# Patient Record
Sex: Male | Born: 1940 | Race: White | Hispanic: No | Marital: Married | Smoking: Never smoker
Health system: Southern US, Community
[De-identification: ages and names within clinical notes are randomized; demographics above are authoritative.]

## PROBLEM LIST (undated history)

## (undated) DIAGNOSIS — F419 Anxiety disorder, unspecified: Secondary | ICD-10-CM

## (undated) DIAGNOSIS — D099 Carcinoma in situ, unspecified: Secondary | ICD-10-CM

## (undated) DIAGNOSIS — R569 Unspecified convulsions: Secondary | ICD-10-CM

## (undated) DIAGNOSIS — N320 Bladder-neck obstruction: Secondary | ICD-10-CM

## (undated) DIAGNOSIS — Z87442 Personal history of urinary calculi: Secondary | ICD-10-CM

## (undated) DIAGNOSIS — C4491 Basal cell carcinoma of skin, unspecified: Secondary | ICD-10-CM

## (undated) DIAGNOSIS — F32A Depression, unspecified: Secondary | ICD-10-CM

## (undated) DIAGNOSIS — N281 Cyst of kidney, acquired: Secondary | ICD-10-CM

## (undated) DIAGNOSIS — I10 Essential (primary) hypertension: Secondary | ICD-10-CM

## (undated) HISTORY — PX: INGUINAL HERNIA REPAIR: SUR1180

## (undated) HISTORY — DX: Carcinoma in situ, unspecified: D09.9

## (undated) HISTORY — DX: Bladder-neck obstruction: N32.0

## (undated) HISTORY — DX: Basal cell carcinoma of skin, unspecified: C44.91

## (undated) HISTORY — DX: Anxiety disorder, unspecified: F41.9

## (undated) HISTORY — PX: TONSILLECTOMY: SUR1361

## (undated) HISTORY — DX: Personal history of urinary calculi: Z87.442

## (undated) HISTORY — DX: Depression, unspecified: F32.A

## (undated) HISTORY — DX: Cyst of kidney, acquired: N28.1

## (undated) HISTORY — DX: Unspecified convulsions: R56.9

## (undated) HISTORY — DX: Essential (primary) hypertension: I10

---

## 2000-01-17 ENCOUNTER — Encounter (HOSPITAL_BASED_OUTPATIENT_CLINIC_OR_DEPARTMENT_OTHER): Payer: Self-pay | Admitting: General Surgery

## 2000-01-21 ENCOUNTER — Ambulatory Visit (HOSPITAL_COMMUNITY): Admission: RE | Admit: 2000-01-21 | Discharge: 2000-01-21 | Payer: Self-pay | Admitting: General Surgery

## 2000-04-09 ENCOUNTER — Encounter: Admission: RE | Admit: 2000-04-09 | Discharge: 2000-04-09 | Payer: Self-pay | Admitting: Family Medicine

## 2000-04-09 ENCOUNTER — Encounter: Payer: Self-pay | Admitting: Family Medicine

## 2000-07-20 ENCOUNTER — Encounter: Admission: RE | Admit: 2000-07-20 | Discharge: 2000-07-20 | Payer: Self-pay | Admitting: Family Medicine

## 2000-07-20 ENCOUNTER — Encounter: Payer: Self-pay | Admitting: Family Medicine

## 2016-02-07 DIAGNOSIS — I10 Essential (primary) hypertension: Secondary | ICD-10-CM | POA: Insufficient documentation

## 2016-02-07 DIAGNOSIS — E7800 Pure hypercholesterolemia, unspecified: Secondary | ICD-10-CM | POA: Insufficient documentation

## 2016-02-08 DIAGNOSIS — N320 Bladder-neck obstruction: Secondary | ICD-10-CM | POA: Insufficient documentation

## 2016-02-08 DIAGNOSIS — R12 Heartburn: Secondary | ICD-10-CM | POA: Insufficient documentation

## 2016-02-08 DIAGNOSIS — R439 Unspecified disturbances of smell and taste: Secondary | ICD-10-CM | POA: Insufficient documentation

## 2016-02-08 DIAGNOSIS — R2241 Localized swelling, mass and lump, right lower limb: Secondary | ICD-10-CM | POA: Insufficient documentation

## 2016-03-07 ENCOUNTER — Ambulatory Visit
Admission: RE | Admit: 2016-03-07 | Discharge: 2016-03-07 | Disposition: A | Discharge status: Home / Self Care | Payer: Medicare Other | Source: Ambulatory Visit | Attending: Family Medicine | Admitting: Family Medicine

## 2016-03-07 ENCOUNTER — Other Ambulatory Visit: Payer: Self-pay | Admitting: Family Medicine

## 2016-03-07 DIAGNOSIS — R109 Unspecified abdominal pain: Secondary | ICD-10-CM

## 2016-03-07 IMAGING — CT CT ABD-PELV W/ CM
3 of 5 series · 12 of 36 positions shown, 18 images · IV contrast (OMNI 300/WATER & [ID] ISOVUE 300)
Comparison: Report from previous CT on [DATE] ; images no
longer available

CLINICAL DATA: Right-sided abdominal and pelvic pain and distention
for several weeks.

EXAM:
CT ABDOMEN AND PELVIS WITH CONTRAST
TECHNIQUE: Multidetector CT imaging of the abdomen and pelvis was performed
using the standard protocol following bolus administration of
intravenous contrast.
CONTRAST:  100mL [9Q] IOPAMIDOL ([9Q]) INJECTION 61%

[Series 3: abd/pelvis with · axial · 0.77mm/px · z∈[-368,-14]mm · 7 of 95 slices shown, 12 images]
[im 12/95  soft-tissue]
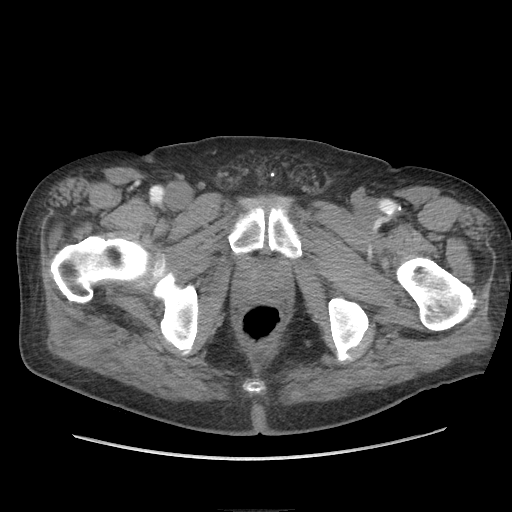
[im 12/95  bone]
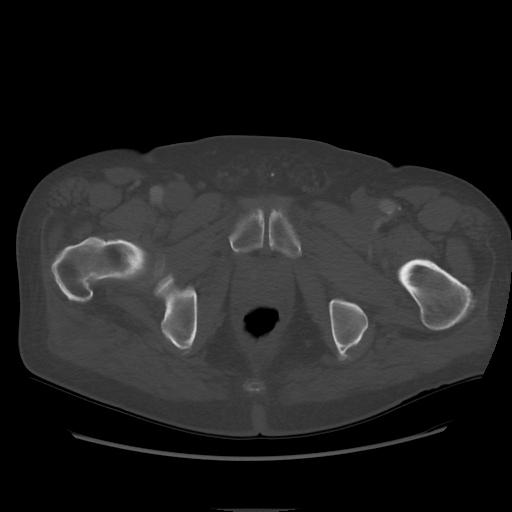
[im 24/95  soft-tissue]
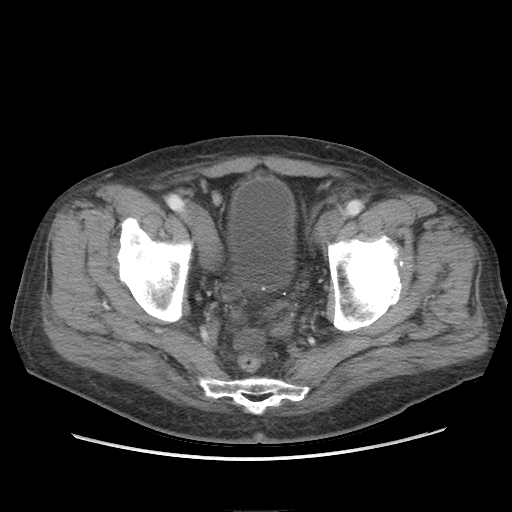
[im 36/95  soft-tissue]
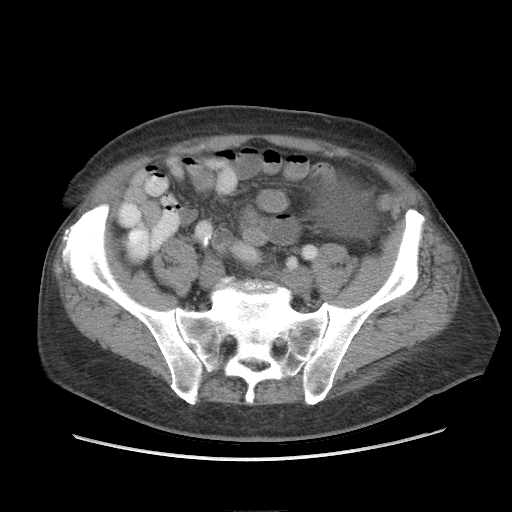
[im 48/95  soft-tissue]
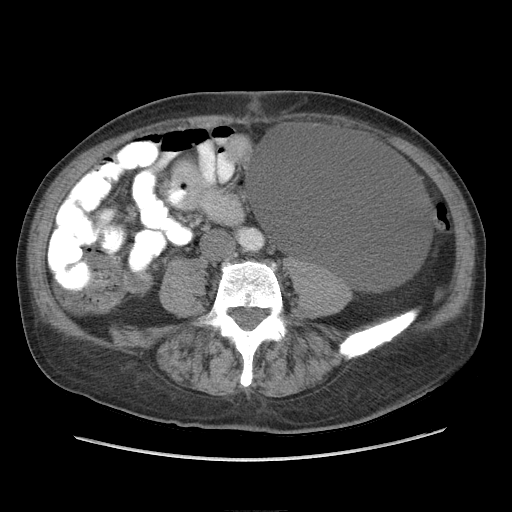
[im 48/95  lung]
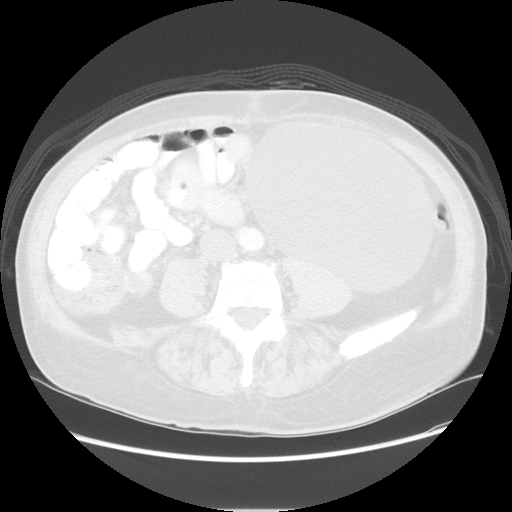
[im 59/95  soft-tissue]
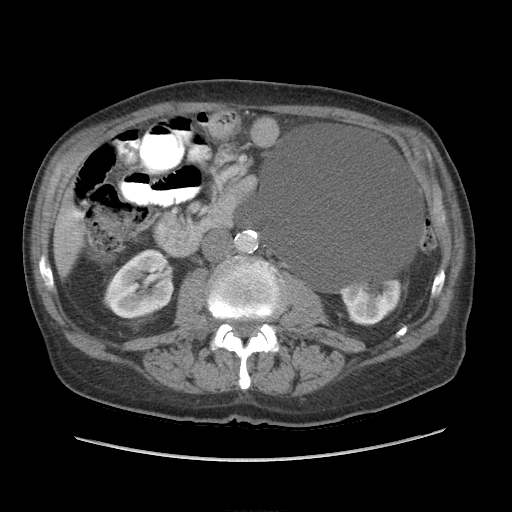
[im 59/95  lung]
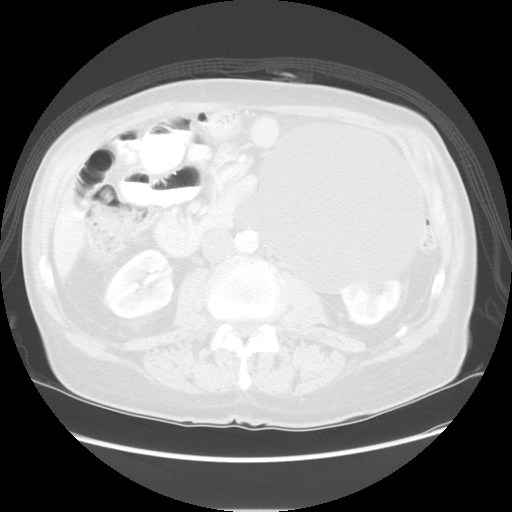
[im 71/95  soft-tissue]
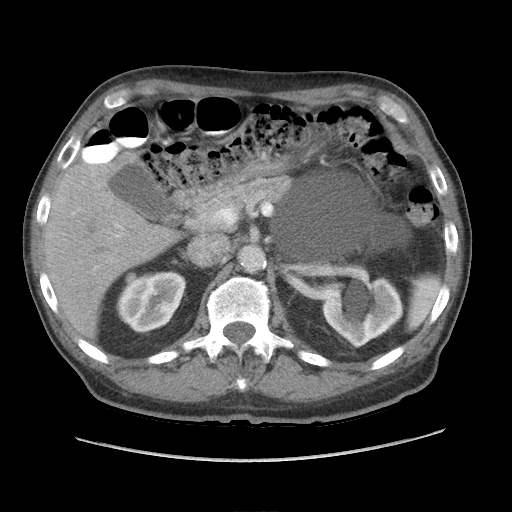
[im 71/95  lung]
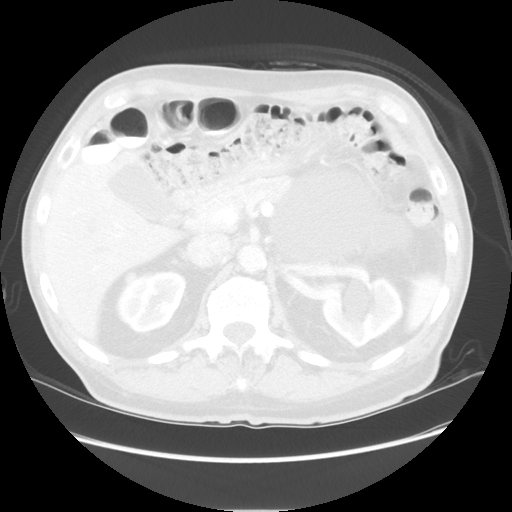
[im 83/95  soft-tissue]
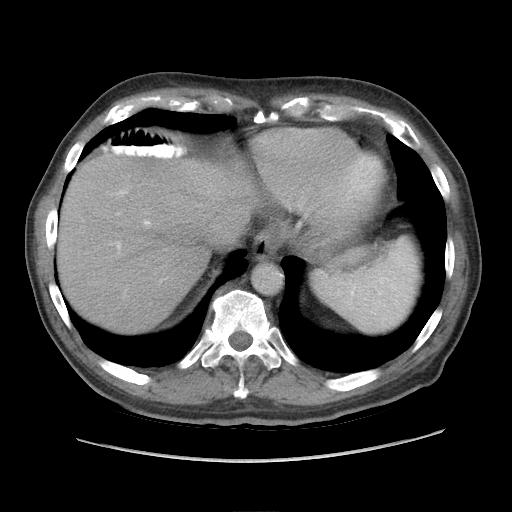
[im 83/95  lung]
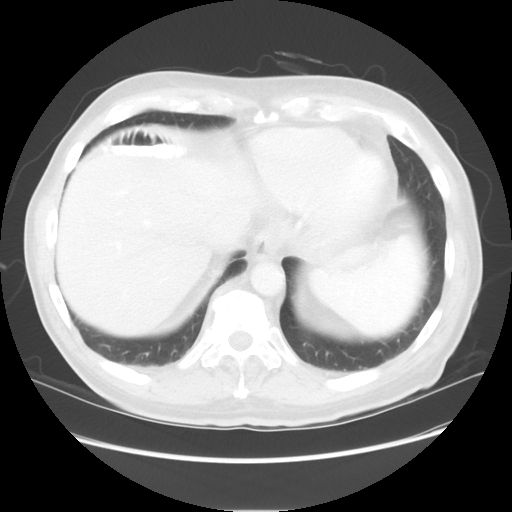

[Series 601: coronal body · coronal · 0.96mm/px · 1 of 120 slices shown, 2 images]
[im 40/120  soft-tissue]
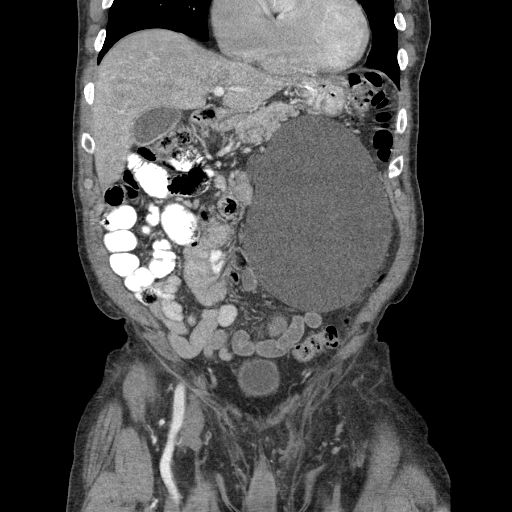
[im 40/120  bone]
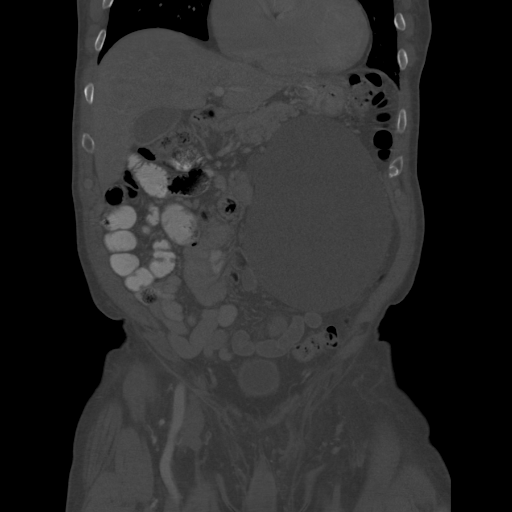

[Series 602: sagittal body · sagittal · 0.96mm/px · 4 of 156 slices shown]
[im 12/156  soft-tissue]
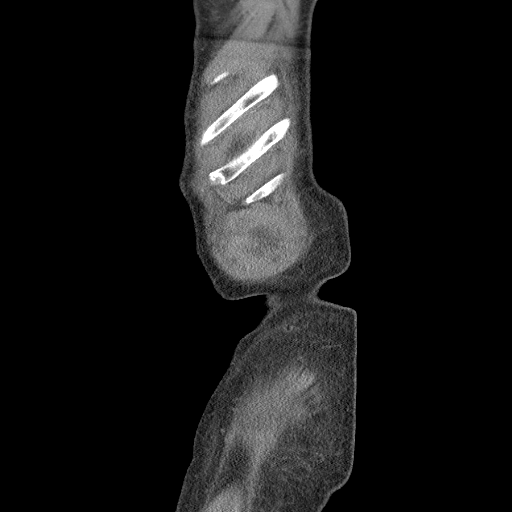
[im 34/156  soft-tissue]
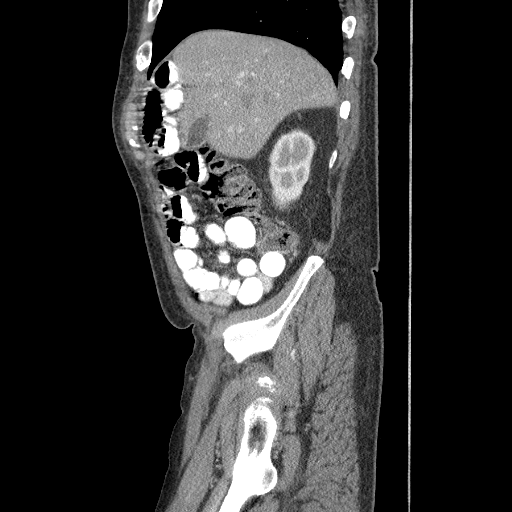
[im 56/156  soft-tissue]
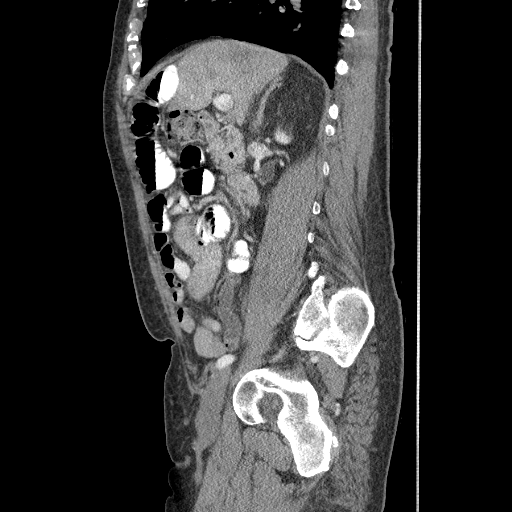
[im 67/156  soft-tissue]
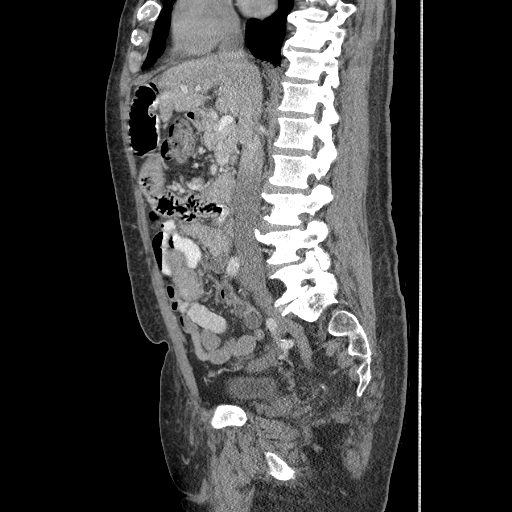

[12 of 36 positions shown; findings below may reference images not displayed]

FINDINGS: Lower chest:  No acute findings.

Hepatobiliary: No masses or other significant abnormality.
Gallbladder is unremarkable.

Pancreas: No mass, inflammatory changes, or other significant
abnormality.

Spleen: Within normal limits in size and appearance.

Adrenals/Urinary Tract: Normal adrenal glands.

8 mm subcapsular lesion in the anterior upper pole of the right
kidney on image 25/series 3 shows findings suspicious for contrast
enhancement with washout on delayed imaging, suspicious for a small
solid renal neoplasm.

A large simple appearing cyst is seen arising from the mid and lower
pole of the left kidney which measures 19.4 x 14.7 cm. No complex
features are identified. This has increased in size from 8 x 10 cm
given on previous CT report. Moderate left hydronephrosis is seen
which appears to be due to mass effect by this large cystic lesion.
This was described on prior report.

No evidence of ureteral calculi or dilatation. 3 mm calculus noted
within the urinary bladder.

Stomach/Bowel: No evidence of obstruction, inflammatory process, or
abnormal fluid collections. Normal appendix visualized. Sigmoid
diverticulosis is demonstrated, without evidence of diverticulitis.

Vascular/Lymphatic: No pathologically enlarged lymph nodes. No
evidence of abdominal aortic aneurysm. Aortic atherosclerosis.

Reproductive: Moderately enlarged prostate with mass effect on
bladder base.

Other: None.

Musculoskeletal:  No suspicious bone lesions identified.
IMPRESSION: 8 mm subcapsular lesion in anterior upper pole of right kidney shows
evidence of contrast enhancement, suspicious for a solid renal
neoplasm. Abdomen MRI without and with contrast is recommended for
further characterization. This recommendation follows ACR consensus
guidelines: Management of the Incidental Renal Mass on CT: A White
Paper of the ACR Incidental Findings Committee. [HOSPITAL]
[9Q]; article in press.

19 cm benign-appearing left renal cyst, which is increased in size
from 10 mm given on previous CT report in [9Q]. Moderate left
hydronephrosis appears to be secondary to mass effect by this cystic
lesion, and was described on previous CT report. No evidence of
ureteral calculi or dilatation.

Moderately enlarged prostate gland, with tiny nonobstructive
calculus within the urinary bladder.

Colonic diverticulosis. No radiographic evidence of diverticulitis.

Aortic atherosclerosis.

## 2016-03-07 MED ORDER — IOHEXOL 300 MG/ML  SOLN
30.0000 mL | Freq: Once | INTRAMUSCULAR | Status: AC | PRN
  Administered 2016-03-07: 30 mL via ORAL

## 2016-03-07 MED ORDER — IOPAMIDOL (ISOVUE-300) INJECTION 61%
100.0000 mL | Freq: Once | INTRAVENOUS | Status: AC | PRN
Start: 2016-03-07 — End: 2016-03-07
  Administered 2016-03-07: 100 mL via INTRAVENOUS

## 2016-04-06 DIAGNOSIS — N289 Disorder of kidney and ureter, unspecified: Secondary | ICD-10-CM | POA: Insufficient documentation

## 2016-07-23 DIAGNOSIS — F4321 Adjustment disorder with depressed mood: Secondary | ICD-10-CM | POA: Insufficient documentation

## 2016-11-13 DIAGNOSIS — L858 Other specified epidermal thickening: Secondary | ICD-10-CM | POA: Insufficient documentation

## 2016-11-30 DIAGNOSIS — C44609 Unspecified malignant neoplasm of skin of left upper limb, including shoulder: Secondary | ICD-10-CM | POA: Insufficient documentation

## 2016-11-30 DIAGNOSIS — D229 Melanocytic nevi, unspecified: Secondary | ICD-10-CM | POA: Insufficient documentation

## 2016-11-30 DIAGNOSIS — L570 Actinic keratosis: Secondary | ICD-10-CM | POA: Insufficient documentation

## 2017-07-16 DIAGNOSIS — Z0000 Encounter for general adult medical examination without abnormal findings: Secondary | ICD-10-CM | POA: Insufficient documentation

## 2019-08-03 ENCOUNTER — Ambulatory Visit: Payer: Medicare PPO | Attending: Internal Medicine

## 2019-08-03 DIAGNOSIS — Z23 Encounter for immunization: Secondary | ICD-10-CM

## 2019-08-03 NOTE — Progress Notes (Signed)
   Covid-19 Vaccination Clinic  Name:  Bill Fry    MRN: [id number] DOB: 07-03-1941  08/03/2019  [name] was observed post Covid-19 immunization for 15 minutes without incidence. He was provided with Vaccine Information Sheet and instruction to access the V-Safe system.   [name] was instructed to call 911 with any severe reactions post vaccine: Marland Kitchen Difficulty breathing  . Swelling of your face and throat  . A fast heartbeat  . A bad rash all over your body  . Dizziness and weakness    Immunizations Administered    Name Date Dose VIS Date Route   Pfizer COVID-19 Vaccine 08/03/2019 10:40 AM 0.3 mL 06/24/2019 Intramuscular   Manufacturer: [place]   Lot: [id number]   [place]: [id number]

## 2019-08-22 ENCOUNTER — Ambulatory Visit: Payer: Medicare PPO | Attending: Internal Medicine

## 2019-08-22 DIAGNOSIS — Z23 Encounter for immunization: Secondary | ICD-10-CM

## 2019-08-22 NOTE — Progress Notes (Signed)
   Covid-19 Vaccination Clinic  Name:  Bill Fry    MRN: [id number] DOB: December 30, 1940  08/22/2019  Bill Fry was observed post Covid-19 immunization for 15 minutes without incidence. He was provided with Vaccine Information Sheet and instruction to access the V-Safe system.   Bill Fry was instructed to call 911 with any severe reactions post vaccine: Marland Kitchen Difficulty breathing  . Swelling of your face and throat  . A fast heartbeat  . A bad rash all over your body  . Dizziness and weakness    Immunizations Administered    Name Date Dose VIS Date Route   Pfizer COVID-19 Vaccine 08/22/2019  3:55 PM 0.3 mL 06/24/2019 Intramuscular   Manufacturer: [place]   Lot: [id number]   [place]: [id number]

## 2019-11-04 DIAGNOSIS — D49519 Neoplasm of unspecified behavior of unspecified kidney: Secondary | ICD-10-CM | POA: Insufficient documentation

## 2019-11-09 ENCOUNTER — Other Ambulatory Visit: Payer: Self-pay | Admitting: Family Medicine

## 2019-11-09 DIAGNOSIS — N320 Bladder-neck obstruction: Secondary | ICD-10-CM

## 2019-11-09 DIAGNOSIS — D49519 Neoplasm of unspecified behavior of unspecified kidney: Secondary | ICD-10-CM

## 2019-11-09 DIAGNOSIS — Q619 Cystic kidney disease, unspecified: Secondary | ICD-10-CM

## 2019-11-09 DIAGNOSIS — R404 Transient alteration of awareness: Secondary | ICD-10-CM

## 2019-12-06 ENCOUNTER — Ambulatory Visit
Admission: RE | Admit: 2019-12-06 | Discharge: 2019-12-06 | Disposition: A | Discharge status: Home / Self Care | Payer: Medicare PPO | Source: Ambulatory Visit | Attending: Family Medicine | Admitting: Family Medicine

## 2019-12-06 ENCOUNTER — Other Ambulatory Visit: Payer: Self-pay

## 2019-12-06 DIAGNOSIS — R404 Transient alteration of awareness: Secondary | ICD-10-CM

## 2019-12-06 DIAGNOSIS — N320 Bladder-neck obstruction: Secondary | ICD-10-CM

## 2019-12-06 DIAGNOSIS — Q619 Cystic kidney disease, unspecified: Secondary | ICD-10-CM

## 2019-12-06 DIAGNOSIS — D49519 Neoplasm of unspecified behavior of unspecified kidney: Secondary | ICD-10-CM

## 2019-12-06 IMAGING — MR MR PELVIS W/O CM
4 of 6 series · 23 of 48 positions shown · non-contrast
Comparison: B [X3].  [DATE]

CLINICAL DATA: History of cystic kidney disease. Bladder outflow
syndrome. Chronic symptoms.

EXAM:
MRI ABDOMEN AND PELVIS WITHOUT CONTRAST
TECHNIQUE: Multiplanar multisequence MR imaging of the abdomen and pelvis was
performed. No intravenous contrast was administered.

[Series 3: cor haste · coronal · 6.0mm · 0.70mm/px · 7 of 25 slices shown]
[im 1/25]
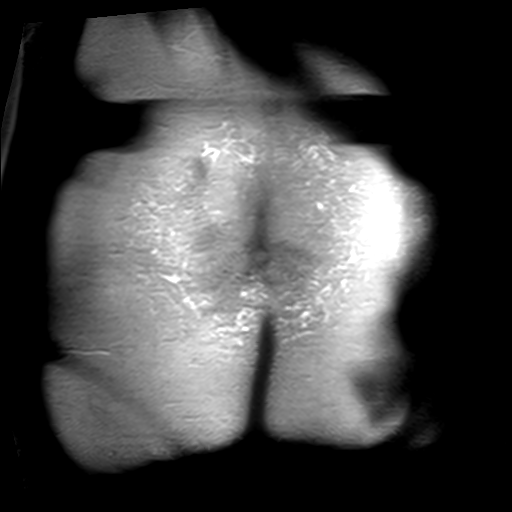
[im 5/25]
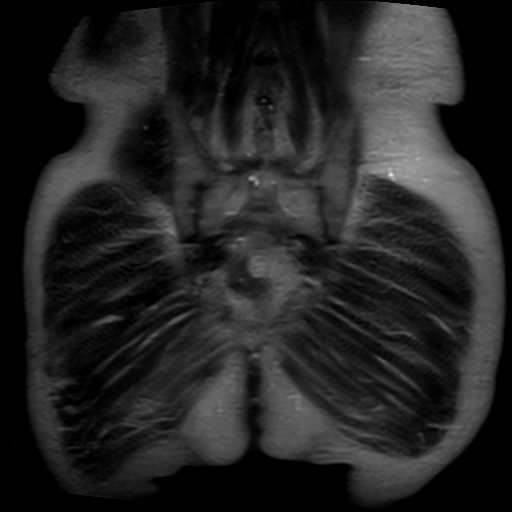
[im 9/25]
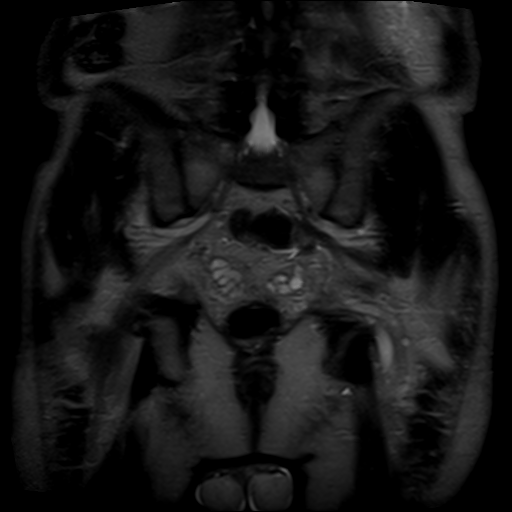
[im 13/25]
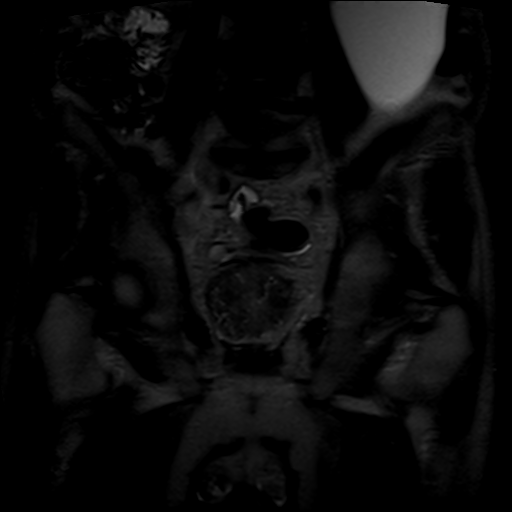
[im 17/25]
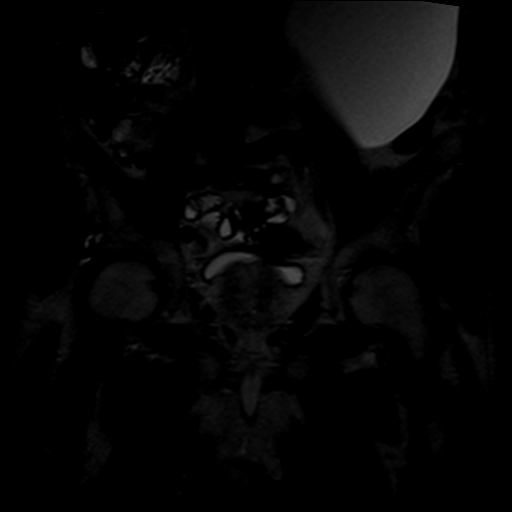
[im 21/25]
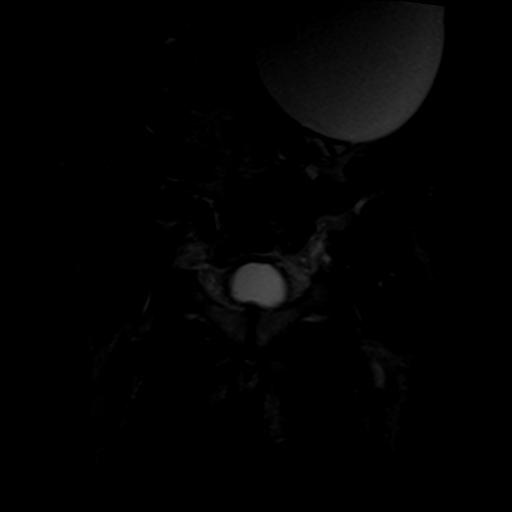
[im 25/25]
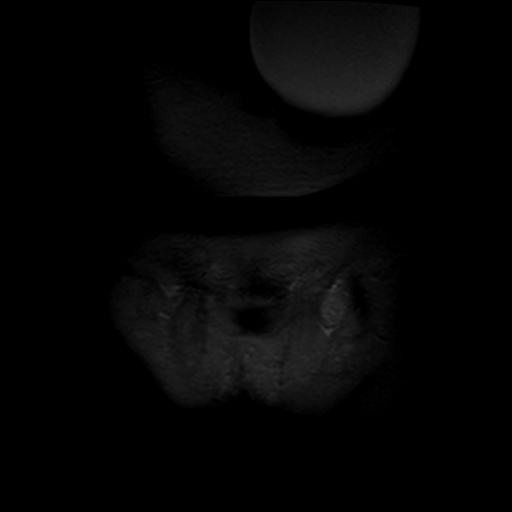

[Series 4: t2_tse_sag · sagittal · 5.0mm · 1.05mm/px · 8 of 27 slices shown]
[im 1/27]
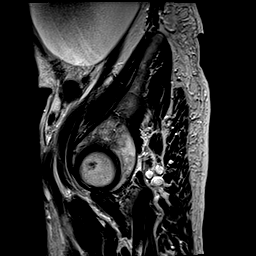
[im 4/27]
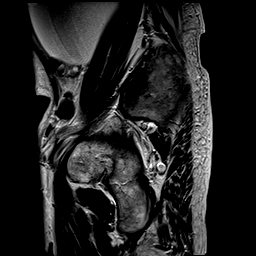
[im 8/27]
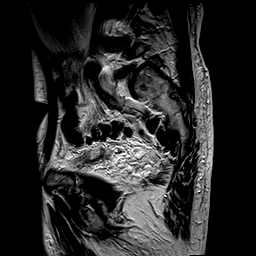
[im 12/27]
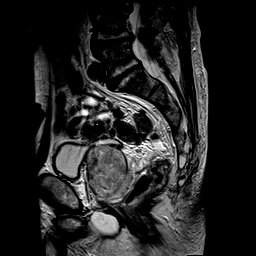
[im 15/27]
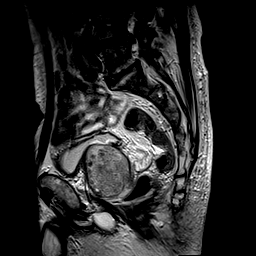
[im 19/27]
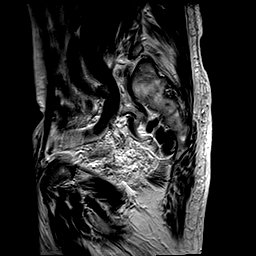
[im 23/27]
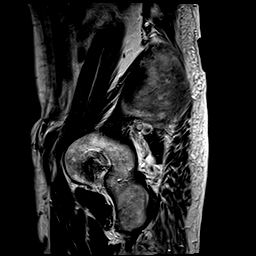
[im 27/27]
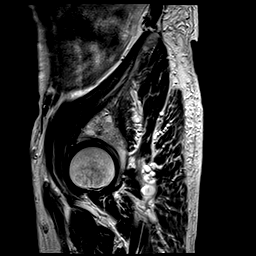

[Series 6: axial spgr-not bh · axial · 5.0mm · 0.47mm/px · z∈[-165,+4]mm · 5 of 32 slices shown]
[im 1/32]
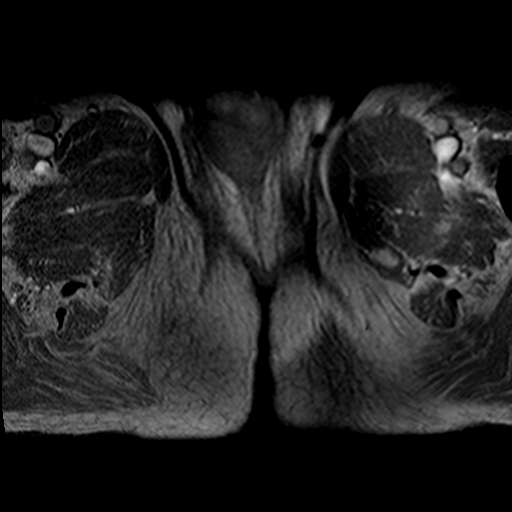
[im 4/32]
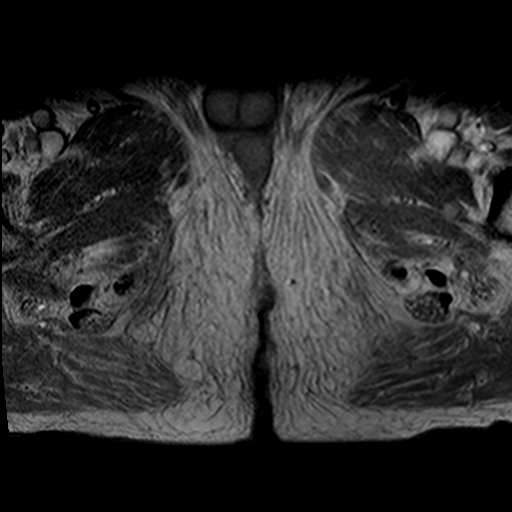
[im 8/32]
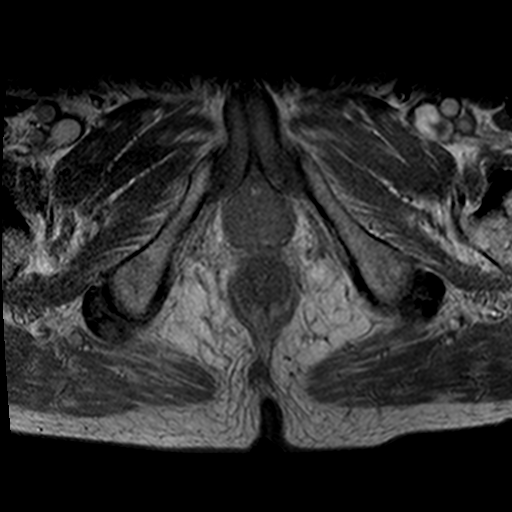
[im 16/32]
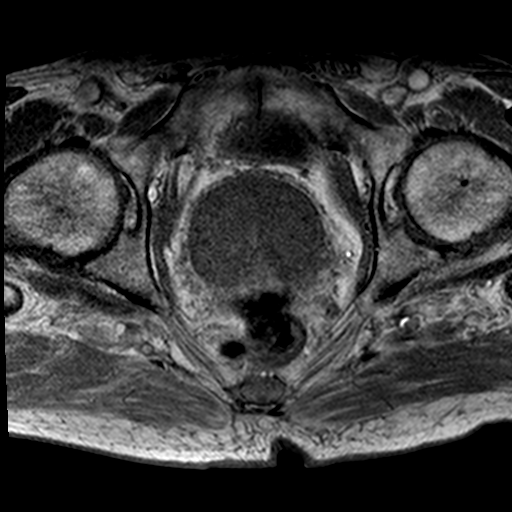
[im 28/32]
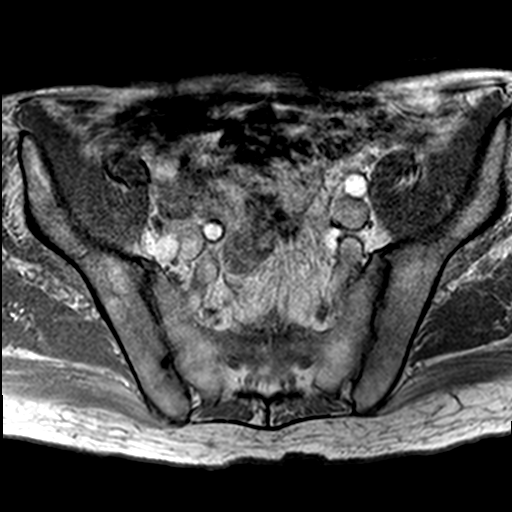

[Series 7: axial spgr fs-pre · axial · non-contrast · 5.0mm · 0.47mm/px · z∈[-133,-8]mm · 3 of 28 slices shown]
[im 4/28]
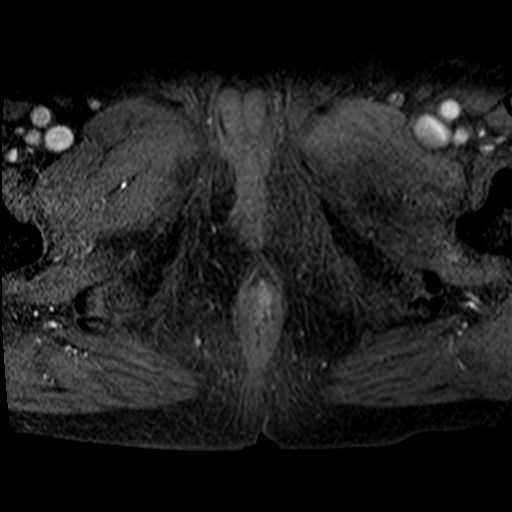
[im 16/28]
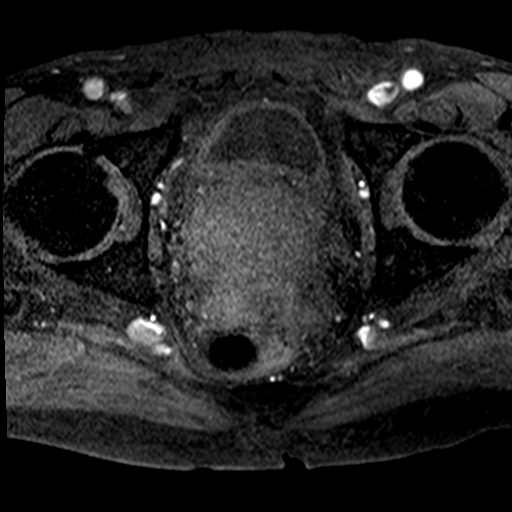
[im 24/28]
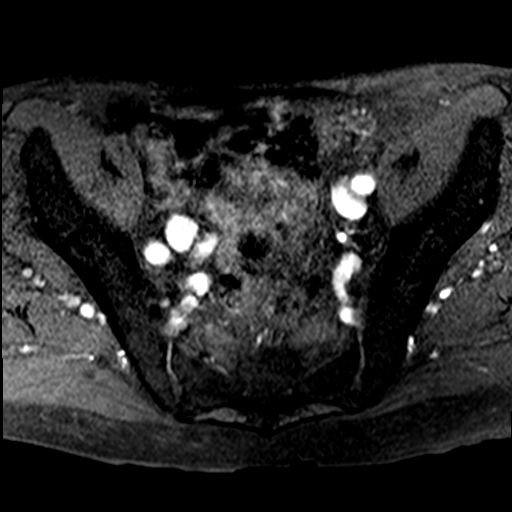

[23 of 48 positions shown; findings below may reference images not displayed]

FINDINGS: Portions of exam are mildly motion degraded.

COMBINED FINDINGS FOR BOTH MR ABDOMEN AND PELVIS

Lower chest: Mild cardiomegaly, without pericardial or pleural
effusion.

Hepatobiliary: Normal noncontrast appearance of the liver,
gallbladder, biliary tract.

Pancreas: Cystic foci within the pancreas, largest of which is in
the pancreatic head at 1.3 cm on [DATE] and [DATE]. No duct dilatation
or acute inflammation.

Spleen:  Normal in size, without focal abnormality.

Adrenals/Urinary Tract: Normal adrenal glands. Tiny bilateral renal
lesions which are likely cysts.

The partially exophytic upper/interpolar right renal 8 mm lesion on
the prior exam is similar in size, including on [DATE]. Incompletely
characterized on this noncontrast exam.

A dominant upper pole left renal lesion is most consistent with a
minimally complex cyst, with a thin septation identified on [DATE].
Measures on the order of 14.2 x 14.1 cm on [DATE]. Compare 14.4 x
cm at the same level on [DATE] (when remeasured). 20.8 cm
craniocaudal today versus 19.1 cm on the prior exam (when
remeasured). This again causes left-sided mild upper pole
caliectasis, including on [DATE].

small right-sided bladder diverticulum on [DATE]. There is also
minimal fluid signal anterior to the right-sided urinary bladder on
[DATE] which could represent a tiny bladder diverticulum.

Stomach/Bowel: Normal stomach and abdominal small bowel loops.
Extensive colonic diverticulosis.

Vascular/Lymphatic: Aortic atherosclerosis. No abdominopelvic
adenopathy.

Reproductive: Moderate prostatomegaly.

Other:  Small volume dependent pelvic fluid.

Musculoskeletal: Lumbosacral spondylosis.
IMPRESSION: 1. Mildly motion degraded exam.
2. Mild enlargement, since [X3], of a minimally complex cyst within
the left kidney. This causes similar mild left-sided upper pole
caliectasis.
3. 8 mm right renal lesion, which was suspicious on prior contrast
enhanced CT, is unchanged in size and incompletely characterized on
this noncontrast exam.
4. Prostatomegaly with at least 1 bladder diverticulum, suggesting
outlet obstruction.
5. Small volume cul-de-sac fluid, nonspecific.
6. 1.3 cm pancreatic head cystic lesion. Most likely a pseudocyst
versus less likely indolent cystic neoplasm. Per consensus criteria,
this warrants follow-up with pre and post contrast abdominal
MRI/MRCP at 2 years. This recommendation follows ACR consensus
guidelines: Management of Incidental Pancreatic Cysts: A White Paper
of the ACR Incidental Findings Committee. [HOSPITAL]

## 2019-12-06 IMAGING — MR MR ABDOMEN W/O CM
8 of 9 series · 35 of 48 positions shown · non-contrast
Comparison: B [X3].  [DATE]

CLINICAL DATA: History of cystic kidney disease. Bladder outflow
syndrome. Chronic symptoms.

EXAM:
MRI ABDOMEN AND PELVIS WITHOUT CONTRAST
TECHNIQUE: Multiplanar multisequence MR imaging of the abdomen and pelvis was
performed. No intravenous contrast was administered.

[Series 2: T2 · coronal · 5.0mm · 0.74mm/px · 2 of 27 slices shown (1 of 2)]
[im 1/27]
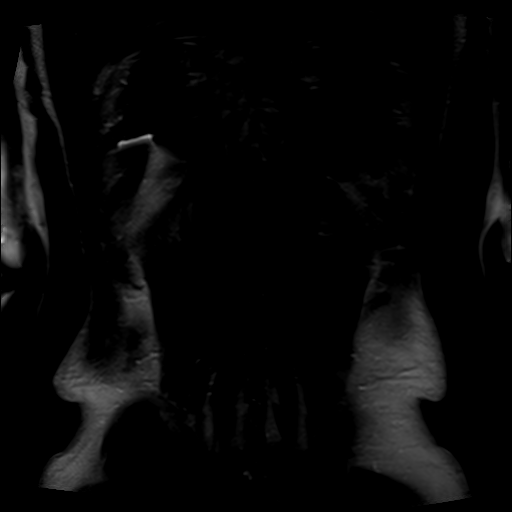
[im 27/27]
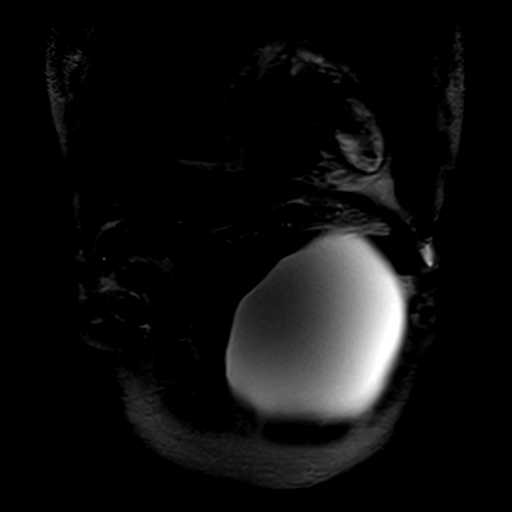

[Series 3: axial haste · axial · 6.0mm · 0.68mm/px · z∈[-264,+26]mm · 4 of 45 slices shown]
[im 1/45]
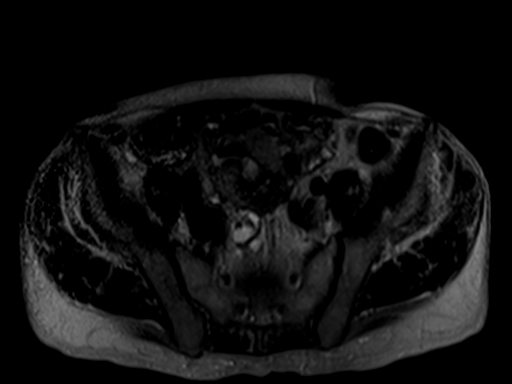
[im 15/45]
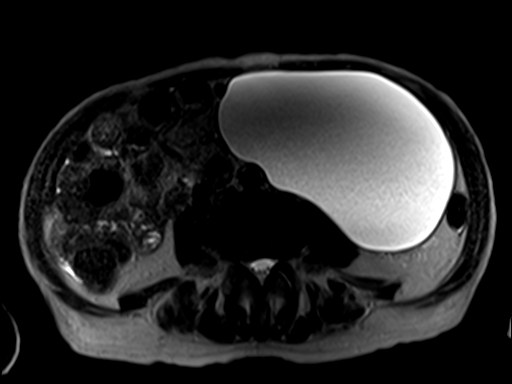
[im 30/45]
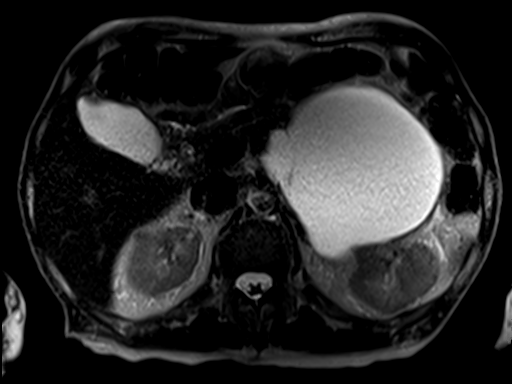
[im 45/45]
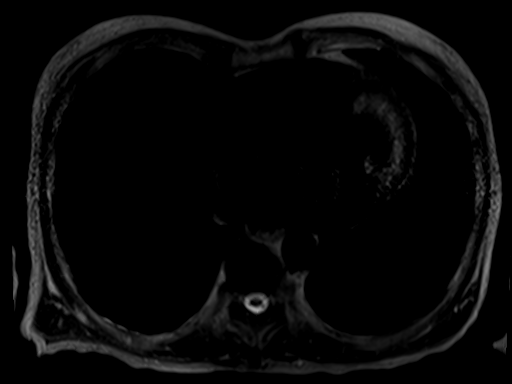

[Series 4: T1 · axial · 6.0mm · 0.68mm/px · z∈[-264,+26]mm · 7 of 90 slices shown]
[im 1/90]
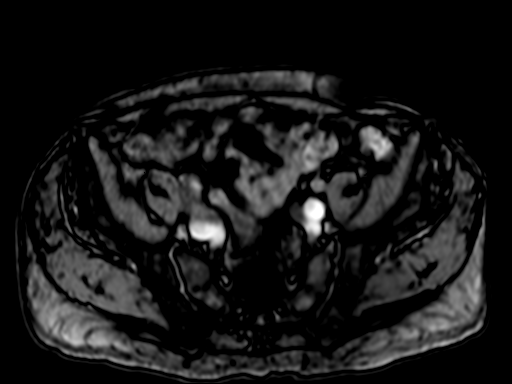
[im 15/90]
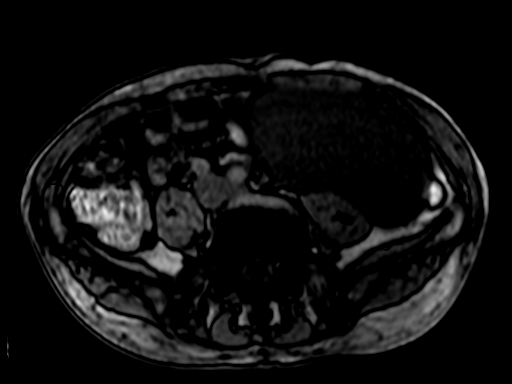
[im 30/90]
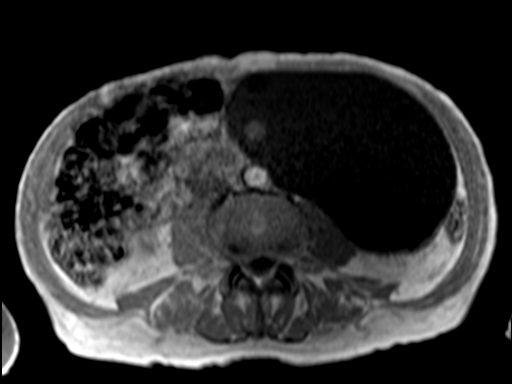
[im 45/90]
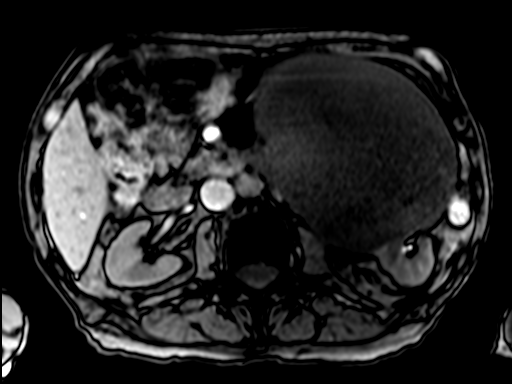
[im 60/90]
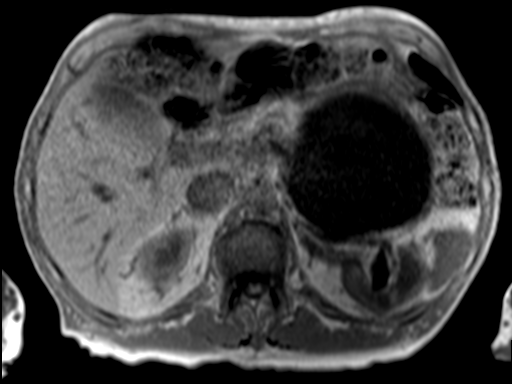
[im 75/90]
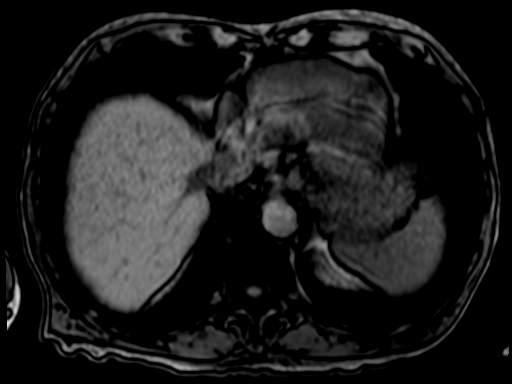
[im 90/90]
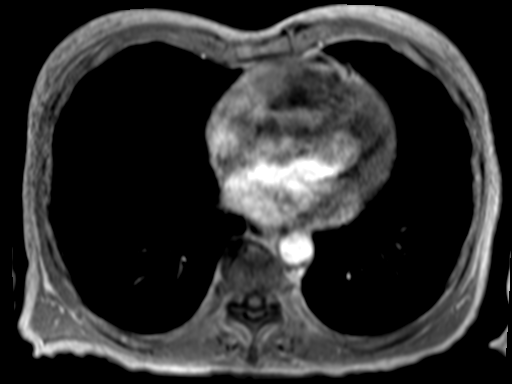

[Series 5: bSSFP · axial · 4.0mm · 0.68mm/px · z∈[-253,+15]mm · 5 of 68 slices shown]
[im 1/68]
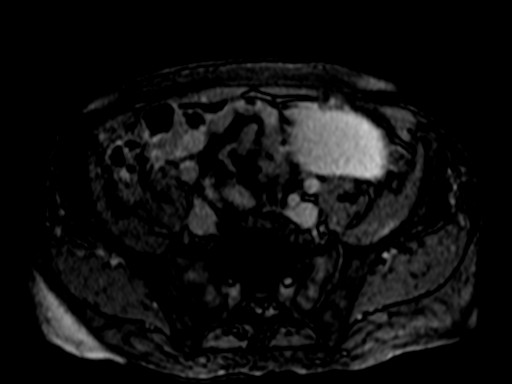
[im 17/68]
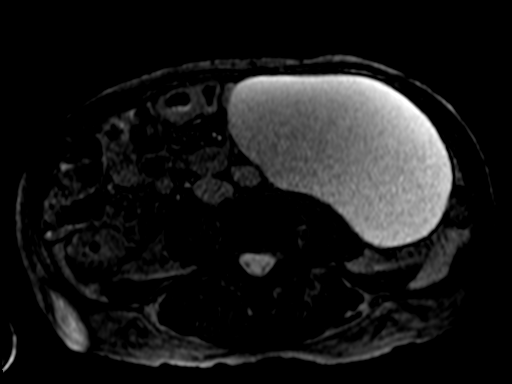
[im 34/68]
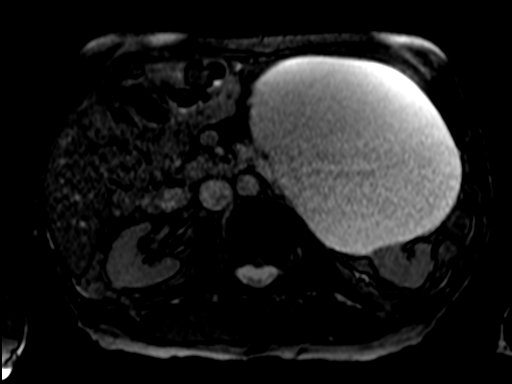
[im 51/68]
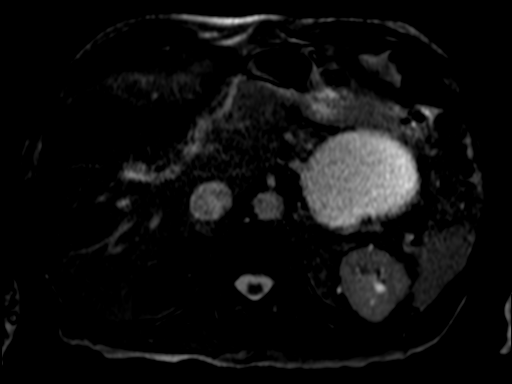
[im 68/68]
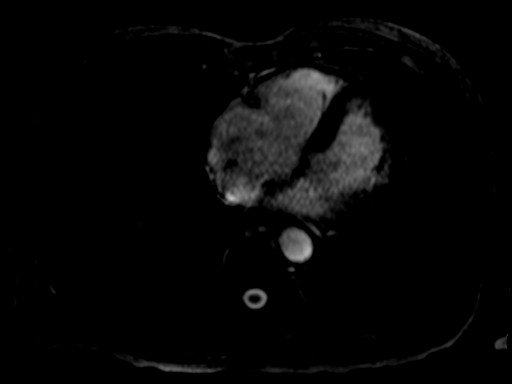

[Series 6: T2 · axial · 6.0mm · 1.09mm/px · z∈[-288,+50]mm · 4 of 48 slices shown (2 of 2)]
[im 1/48]
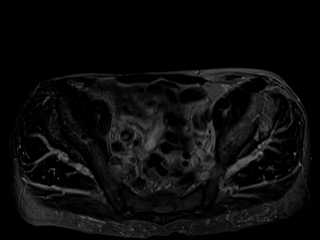
[im 16/48]
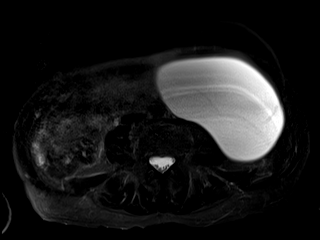
[im 32/48]
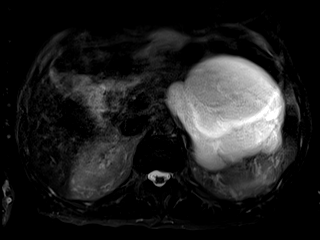
[im 48/48]
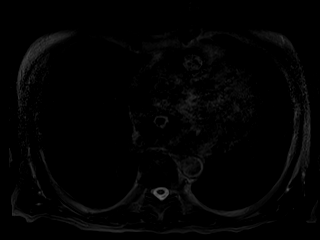

[Series 7: ep2d_diff_b50_500_800_p2_trig · axial · 6.0mm · 1.82mm/px · z∈[-288,+50]mm · 8 of 144 slices shown]
[im 1/144]
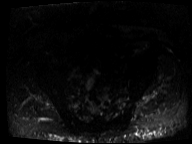
[im 27/144]
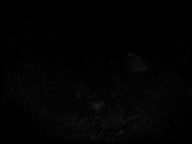
[im 40/144]
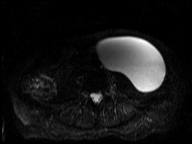
[im 66/144]
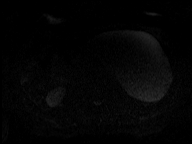
[im 79/144]
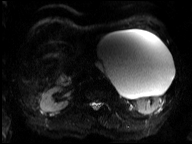
[im 105/144]
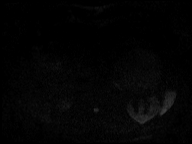
[im 118/144]
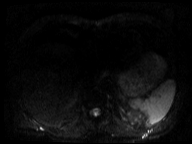
[im 144/144]
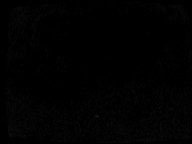

[Series 8: ep2d_diff_b50_500_800_p2_trig_adc · axial · 6.0mm · 1.82mm/px · z∈[-288,+50]mm · 4 of 48 slices shown]
[im 1/48]
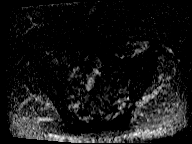
[im 16/48]
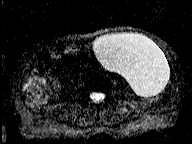
[im 32/48]
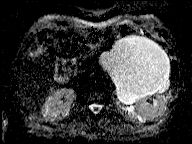
[im 48/48]
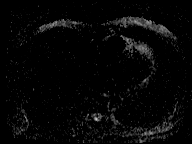

[Series 9: T1 dynamic · axial · non-contrast · 2.5mm · 0.74mm/px · 1 of 104 slices shown]
[im 1/104]
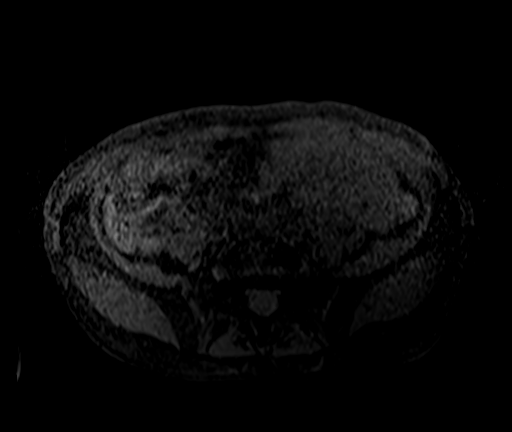

[35 of 48 positions shown; findings below may reference images not displayed]

FINDINGS: Portions of exam are mildly motion degraded.

COMBINED FINDINGS FOR BOTH MR ABDOMEN AND PELVIS

Lower chest: Mild cardiomegaly, without pericardial or pleural
effusion.

Hepatobiliary: Normal noncontrast appearance of the liver,
gallbladder, biliary tract.

Pancreas: Cystic foci within the pancreas, largest of which is in
the pancreatic head at 1.3 cm on [DATE] and [DATE]. No duct dilatation
or acute inflammation.

Spleen:  Normal in size, without focal abnormality.

Adrenals/Urinary Tract: Normal adrenal glands. Tiny bilateral renal
lesions which are likely cysts.

The partially exophytic upper/interpolar right renal 8 mm lesion on
the prior exam is similar in size, including on [DATE]. Incompletely
characterized on this noncontrast exam.

A dominant upper pole left renal lesion is most consistent with a
minimally complex cyst, with a thin septation identified on [DATE].
Measures on the order of 14.2 x 14.1 cm on [DATE]. Compare 14.4 x
cm at the same level on [DATE] (when remeasured). 20.8 cm
craniocaudal today versus 19.1 cm on the prior exam (when
remeasured). This again causes left-sided mild upper pole
caliectasis, including on [DATE].

small right-sided bladder diverticulum on [DATE]. There is also
minimal fluid signal anterior to the right-sided urinary bladder on
[DATE] which could represent a tiny bladder diverticulum.

Stomach/Bowel: Normal stomach and abdominal small bowel loops.
Extensive colonic diverticulosis.

Vascular/Lymphatic: Aortic atherosclerosis. No abdominopelvic
adenopathy.

Reproductive: Moderate prostatomegaly.

Other:  Small volume dependent pelvic fluid.

Musculoskeletal: Lumbosacral spondylosis.
IMPRESSION: 1. Mildly motion degraded exam.
2. Mild enlargement, since [X3], of a minimally complex cyst within
the left kidney. This causes similar mild left-sided upper pole
caliectasis.
3. 8 mm right renal lesion, which was suspicious on prior contrast
enhanced CT, is unchanged in size and incompletely characterized on
this noncontrast exam.
4. Prostatomegaly with at least 1 bladder diverticulum, suggesting
outlet obstruction.
5. Small volume cul-de-sac fluid, nonspecific.
6. 1.3 cm pancreatic head cystic lesion. Most likely a pseudocyst
versus less likely indolent cystic neoplasm. Per consensus criteria,
this warrants follow-up with pre and post contrast abdominal
MRI/MRCP at 2 years. This recommendation follows ACR consensus
guidelines: Management of Incidental Pancreatic Cysts: A White Paper
of the ACR Incidental Findings Committee. [HOSPITAL]

## 2019-12-06 IMAGING — MR MR HEAD W/O CM
10 series · 48 of 48 positions shown · non-contrast
Comparison: None.

CLINICAL DATA: Transient alteration of awareness.

EXAM:
MRI HEAD WITHOUT CONTRAST
TECHNIQUE: Multiplanar, multiecho pulse sequences of the brain and surrounding
structures were obtained without intravenous contrast.

[Series 2: T1 · sagittal · 5.0mm · 0.45mm/px · 2 of 21 slices shown]
[im 1/21]
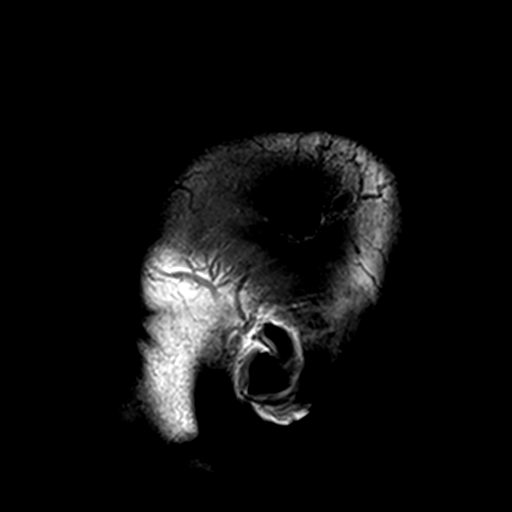
[im 21/21]
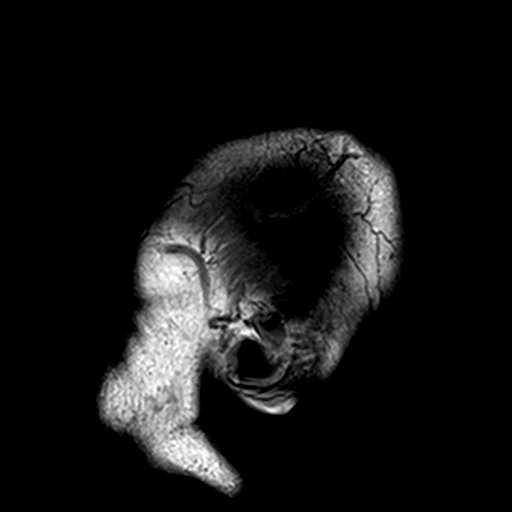

[Series 3: DWI · axial · 3.0mm · 1.80mm/px · z∈[-58,+96]mm · 9 of 105 slices shown (1 of 4)]
[im 1/105]
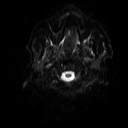
[im 14/105]
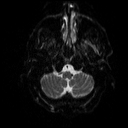
[im 27/105]
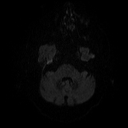
[im 40/105]
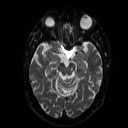
[im 53/105]
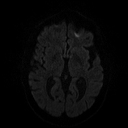
[im 66/105]
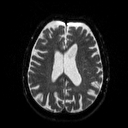
[im 79/105]
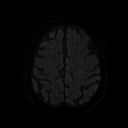
[im 92/105]
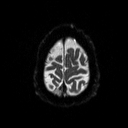
[im 105/105]
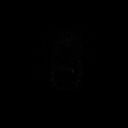

[Series 4: DWI · axial · 3.0mm · 1.80mm/px · z∈[-58,+96]mm · 4 of 51 slices shown (2 of 4)]
[im 1/51]
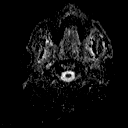
[im 17/51]
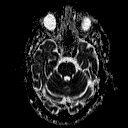
[im 34/51]
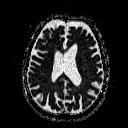
[im 51/51]
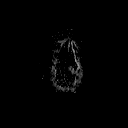

[Series 5: DWI · coronal · 5.0mm · 1.80mm/px · 6 of 68 slices shown (3 of 4)]
[im 1/68]
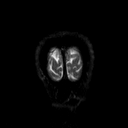
[im 14/68]
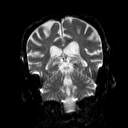
[im 27/68]
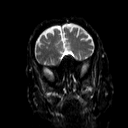
[im 41/68]
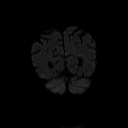
[im 54/68]
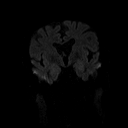
[im 68/68]
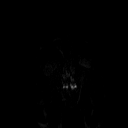

[Series 6: DWI · coronal · 5.0mm · 1.80mm/px · 3 of 34 slices shown (4 of 4)]
[im 1/34]
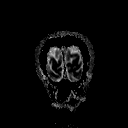
[im 17/34]
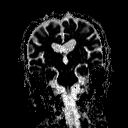
[im 34/34]
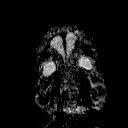

[Series 7: T2 · axial · 5.0mm · 0.60mm/px · z∈[-66,+100]mm · 2 of 25 slices shown (1 of 2)]
[im 1/25]
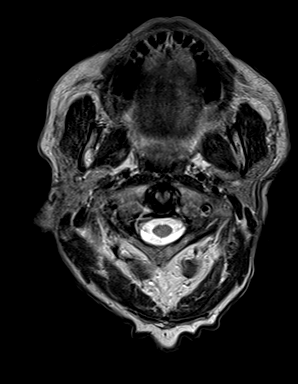
[im 25/25]
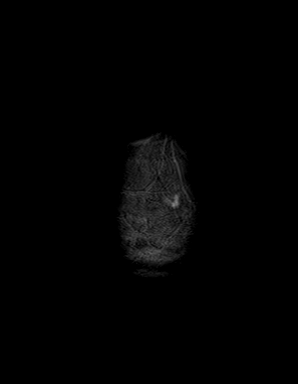

[Series 8: FLAIR · axial · 3.0mm · 0.45mm/px · z∈[-59,+89]mm · 3 of 33 slices shown]
[im 1/33]
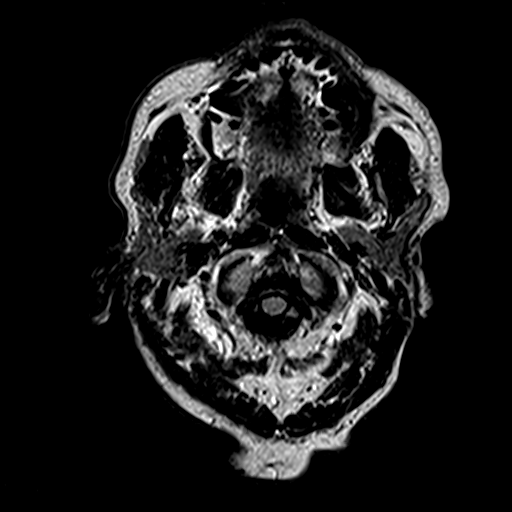
[im 17/33]
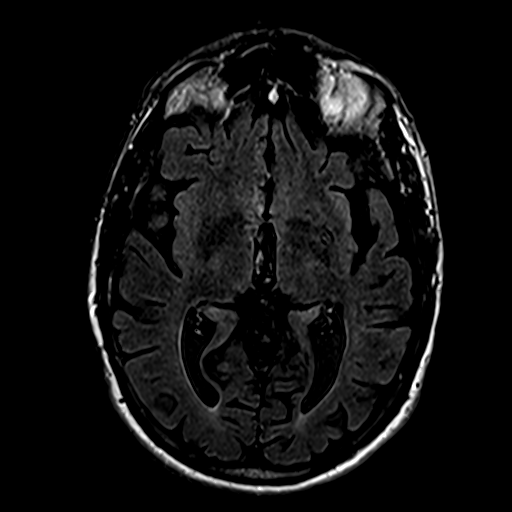
[im 33/33]
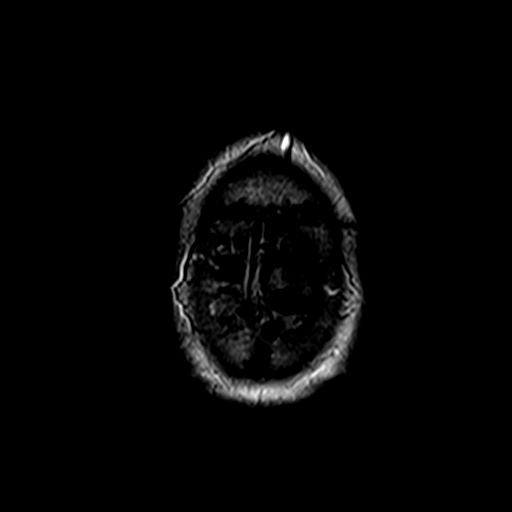

[Series 10: swi_images · axial · 4.0mm · 0.90mm/px · z∈[-62,+93]mm · 4 of 40 slices shown]
[im 1/40]
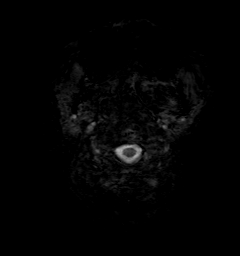
[im 14/40]
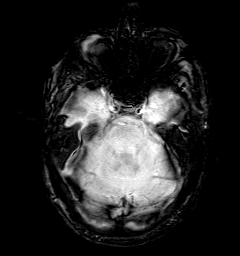
[im 27/40]
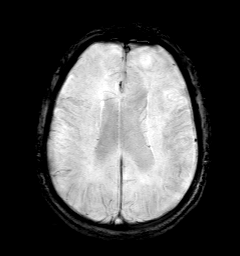
[im 40/40]
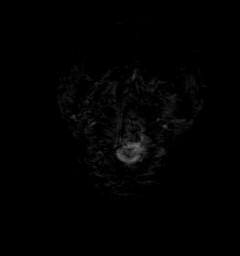

[Series 11: t1_mpr_tra · axial · 1.0mm · 0.71mm/px · z∈[-54,+87]mm · 13 of 144 slices shown]
[im 1/144]
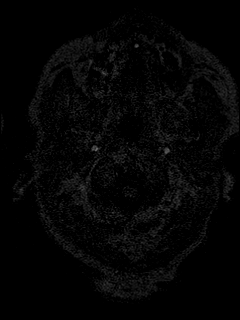
[im 12/144]
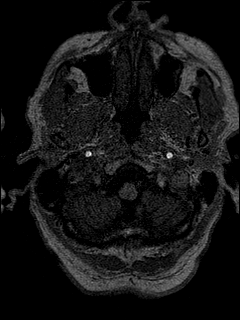
[im 24/144]
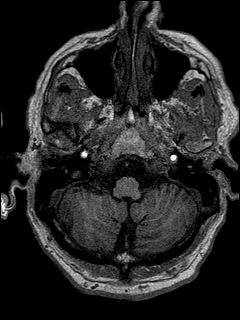
[im 36/144]
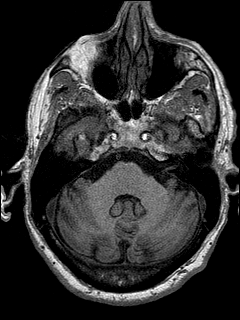
[im 48/144]
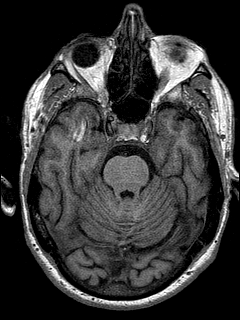
[im 60/144]
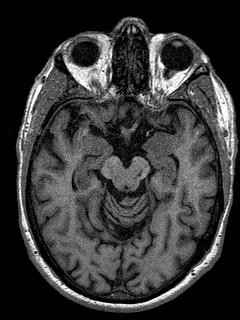
[im 72/144]
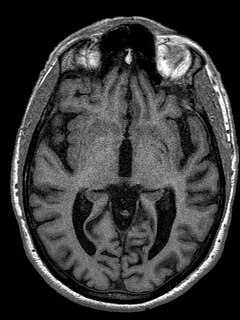
[im 84/144]
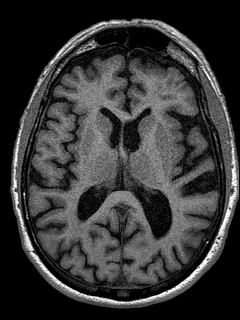
[im 96/144]
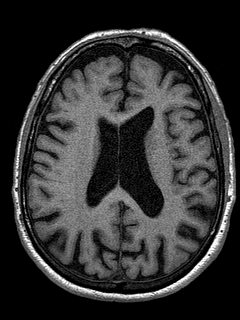
[im 108/144]
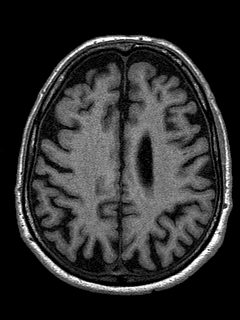
[im 120/144]
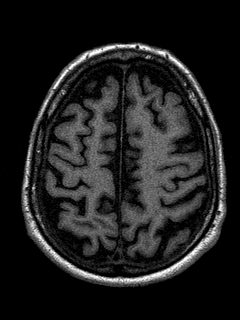
[im 132/144]
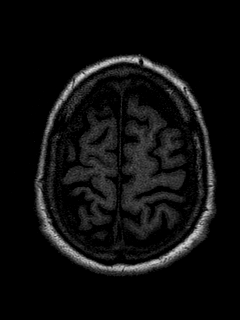
[im 144/144]
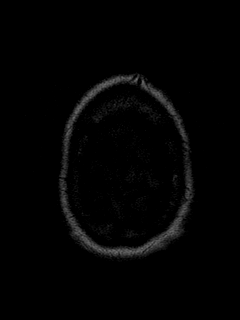

[Series 12: T2 · coronal · 5.0mm · 0.45mm/px · 2 of 25 slices shown (2 of 2)]
[im 1/25]
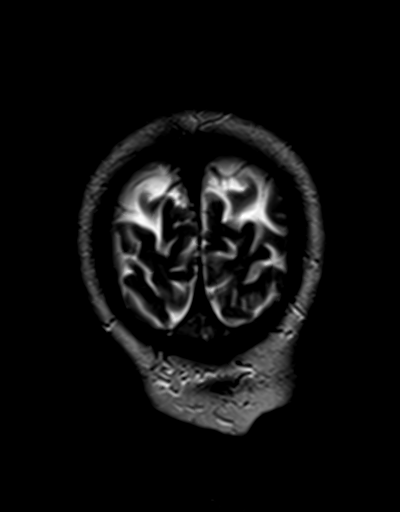
[im 25/25]
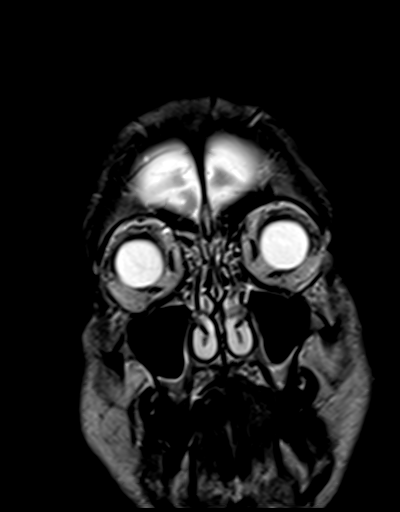

[48 of 48 positions shown; findings below may reference images not displayed]

FINDINGS: Brain: Moderate atrophy. Negative for hydrocephalus. Negative for
acute infarct. Minimal chronic microvascular ischemic change in the
white matter. Chronic microhemorrhage right cerebellum. No other
areas of hemorrhage. Negative for mass edema or midline shift.

Vascular: Normal arterial flow voids.

Skull and upper cervical spine: No focal skeletal lesion.

Sinuses/Orbits: Mucosal edema paranasal sinuses.  Negative orbit

Other: None
IMPRESSION: Atrophy and mild chronic microvascular ischemic change in the white
matter. Chronic microhemorrhage in the right cerebellum.

No acute abnormality.

## 2019-12-20 ENCOUNTER — Other Ambulatory Visit: Payer: Self-pay

## 2019-12-20 ENCOUNTER — Ambulatory Visit: Payer: Medicare PPO | Admitting: Neurology

## 2019-12-20 ENCOUNTER — Encounter: Payer: Self-pay | Admitting: Neurology

## 2019-12-20 VITALS — BP 147/82 | HR 56 | Ht 72.0 in | Wt 164.0 lb

## 2019-12-20 DIAGNOSIS — G40209 Localization-related (focal) (partial) symptomatic epilepsy and epileptic syndromes with complex partial seizures, not intractable, without status epilepticus: Secondary | ICD-10-CM | POA: Insufficient documentation

## 2019-12-20 DIAGNOSIS — G3184 Mild cognitive impairment, so stated: Secondary | ICD-10-CM

## 2019-12-20 MED ORDER — DIVALPROEX SODIUM ER 500 MG PO TB24: 500.0000 mg | ORAL_TABLET | Freq: Every evening | ORAL | 11 refills | Status: DC
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
## 2019-12-21 LAB — VITAMIN B12: Vitamin B-12: 816 pg/mL (ref 232–1245)

## 2019-12-21 LAB — ANA W/REFLEX IF POSITIVE: Anti Nuclear Antibody (ANA): NEGATIVE

## 2019-12-21 LAB — C-REACTIVE PROTEIN: CRP: <1 mg/L (ref 0–10)

## 2019-12-21 LAB — SEDIMENTATION RATE: Sed Rate: 2 mm/hr (ref 0–30)

## 2019-12-21 LAB — VITAMIN D 25 HYDROXY (VIT D DEFICIENCY, FRACTURES): Vit D, 25-Hydroxy: 16.1 ng/mL — ABNORMAL LOW (ref 30.0–100.0)

## 2019-12-21 LAB — RPR: RPR Ser Ql: NONREACTIVE

## 2019-12-21 LAB — TSH: TSH: 2.75 u[IU]/mL (ref 0.450–4.500)

## 2020-01-18 ENCOUNTER — Other Ambulatory Visit: Payer: Self-pay

## 2020-01-18 ENCOUNTER — Ambulatory Visit: Payer: Medicare PPO | Admitting: Neurology

## 2020-01-18 DIAGNOSIS — G40209 Localization-related (focal) (partial) symptomatic epilepsy and epileptic syndromes with complex partial seizures, not intractable, without status epilepticus: Secondary | ICD-10-CM | POA: Diagnosis not present

## 2020-01-18 DIAGNOSIS — G3184 Mild cognitive impairment, so stated: Secondary | ICD-10-CM

## 2020-01-19 NOTE — Procedures (Signed)
° °  HISTORY: 78 years old male, presented with blacking out spells  TECHNIQUE:  This is a routine 16 channel EEG recording with one channel devoted to a limited EKG recording.  It was performed during wakefulness, drowsiness and asleep.  Hyperventilation and photic stimulation were performed as activating procedures.  There are minimum muscle and movement artifact noted.  Upon maximum arousal, posterior dominant waking rhythm consistent of rhythmic alpha range activity, with frequency of 11 hz. Activities are symmetric over the bilateral posterior derivations and attenuated with eye opening.  Hyperventilation produced mild/moderate buildup with higher amplitude and the slower activities noted.  Photic stimulation did not alter the tracing.  During EEG recording, patient developed drowsiness and no deeper stage of sleep was achieved  During EEG recording, there was no epileptiform discharge noted.  EKG demonstrate sinus rhythm, with heart rate of 56 bpm  CONCLUSION: This is a  normal awake EEG.  There is no electrodiagnostic evidence of epileptiform discharge.  Marcial Pacas, M.D. Ph.D.  [facility] [place],  [address] Phone: [phone] Fax:      [phone]

## 2020-01-31 ENCOUNTER — Telehealth: Payer: Self-pay | Admitting: Neurology

## 2020-01-31 NOTE — Telephone Encounter (Signed)
Pt is asking to be called on his mobile with the results to his EEG

## 2020-01-31 NOTE — Telephone Encounter (Signed)
I returned the call to the patient. He did not see his mychart message with the results. He was informed his EEG was normal. He will continue his medication as prescribed and keep his pending follow up appt.

## 2020-02-22 ENCOUNTER — Ambulatory Visit: Payer: Medicare PPO | Admitting: Neurology

## 2020-02-22 ENCOUNTER — Encounter: Payer: Self-pay | Admitting: Neurology

## 2020-02-22 VITALS — BP 142/78 | HR 47 | Ht 72.0 in | Wt 166.2 lb

## 2020-02-22 DIAGNOSIS — G40209 Localization-related (focal) (partial) symptomatic epilepsy and epileptic syndromes with complex partial seizures, not intractable, without status epilepticus: Secondary | ICD-10-CM | POA: Diagnosis not present

## 2020-02-22 DIAGNOSIS — G3184 Mild cognitive impairment, so stated: Secondary | ICD-10-CM | POA: Diagnosis not present

## 2020-02-22 MED ORDER — DIVALPROEX SODIUM ER 500 MG PO TB24: 1000.0000 mg | ORAL_TABLET | Freq: Every evening | ORAL | 4 refills | Status: DC
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
## 2020-02-23 LAB — CBC WITH DIFFERENTIAL
Basophils Absolute: 0 10*3/uL (ref 0.0–0.2)
Basos: 1 %
EOS (ABSOLUTE): 0 10*3/uL (ref 0.0–0.4)
Eos: 0 %
Hematocrit: 36.3 % — ABNORMAL LOW (ref 37.5–51.0)
Hemoglobin: 12.3 g/dL — ABNORMAL LOW (ref 13.0–17.7)
Immature Grans (Abs): 0 10*3/uL (ref 0.0–0.1)
Immature Granulocytes: 0 %
Lymphocytes Absolute: 1 10*3/uL (ref 0.7–3.1)
Lymphs: 19 %
MCH: 31.1 pg (ref 26.6–33.0)
MCHC: 33.9 g/dL (ref 31.5–35.7)
MCV: 92 fL (ref 79–97)
Monocytes Absolute: 0.7 10*3/uL (ref 0.1–0.9)
Monocytes: 13 %
Neutrophils Absolute: 3.7 10*3/uL (ref 1.4–7.0)
Neutrophils: 67 %
RBC: 3.95 x10E6/uL — ABNORMAL LOW (ref 4.14–5.80)
RDW: 12.6 % (ref 11.6–15.4)
WBC: 5.4 10*3/uL (ref 3.4–10.8)

## 2020-02-23 LAB — COMPREHENSIVE METABOLIC PANEL
ALT: 11 IU/L (ref 0–44)
AST: 18 IU/L (ref 0–40)
Albumin/Globulin Ratio: 2.7 — ABNORMAL HIGH (ref 1.2–2.2)
Albumin: 4.3 g/dL (ref 3.7–4.7)
Alkaline Phosphatase: 53 IU/L (ref 48–121)
BUN/Creatinine Ratio: 16 (ref 10–24)
BUN: 16 mg/dL (ref 8–27)
Bilirubin Total: 0.9 mg/dL (ref 0.0–1.2)
CO2: 28 mmol/L (ref 20–29)
Calcium: 9.3 mg/dL (ref 8.6–10.2)
Chloride: 96 mmol/L (ref 96–106)
Creatinine, Ser: 0.99 mg/dL (ref 0.76–1.27)
GFR calc Af Amer: 83 mL/min/{1.73_m2} (ref >59)
GFR calc non Af Amer: 72 mL/min/{1.73_m2} (ref >59)
Globulin, Total: 1.6 g/dL (ref 1.5–4.5)
Glucose: 87 mg/dL (ref 65–99)
Potassium: 4.5 mmol/L (ref 3.5–5.2)
Sodium: 137 mmol/L (ref 134–144)
Total Protein: 5.9 g/dL — ABNORMAL LOW (ref 6.0–8.5)

## 2020-02-23 LAB — VALPROIC ACID LEVEL: Valproic Acid Lvl: 44 ug/mL — ABNORMAL LOW (ref 50–100)

## 2020-06-18 ENCOUNTER — Telehealth: Payer: Medicare PPO | Admitting: Neurology

## 2020-06-18 NOTE — Telephone Encounter (Signed)
Mcarthur Rossetti Josem Kaufmann: [id number] (exp. 06/18/20 to 07/18/20) order sent to [facility]. They will reach out to the patient to schedule.

## 2020-06-20 DIAGNOSIS — G40909 Epilepsy, unspecified, not intractable, without status epilepticus: Secondary | ICD-10-CM | POA: Insufficient documentation

## 2020-06-20 DIAGNOSIS — R351 Nocturia: Secondary | ICD-10-CM | POA: Insufficient documentation

## 2020-07-03 ENCOUNTER — Ambulatory Visit
Admission: RE | Admit: 2020-07-03 | Discharge: 2020-07-03 | Disposition: A | Discharge status: Home / Self Care | Payer: Medicare PPO | Source: Ambulatory Visit | Attending: Neurology | Admitting: Neurology

## 2020-07-03 ENCOUNTER — Other Ambulatory Visit: Payer: Self-pay

## 2020-07-03 DIAGNOSIS — G40209 Localization-related (focal) (partial) symptomatic epilepsy and epileptic syndromes with complex partial seizures, not intractable, without status epilepticus: Secondary | ICD-10-CM

## 2020-07-03 MED ORDER — GADOBENATE DIMEGLUMINE 529 MG/ML IV SOLN
15.0000 mL | Freq: Once | INTRAVENOUS | Status: AC | PRN
  Administered 2020-07-03: 15 mL via INTRAVENOUS

## 2020-07-10 ENCOUNTER — Other Ambulatory Visit: Payer: Medicare PPO

## 2020-08-27 ENCOUNTER — Ambulatory Visit: Payer: Medicare PPO | Admitting: Neurology

## 2020-08-27 ENCOUNTER — Other Ambulatory Visit: Payer: Self-pay

## 2020-08-27 ENCOUNTER — Encounter: Payer: Self-pay | Admitting: Neurology

## 2020-08-27 VITALS — Ht 71.0 in | Wt 167.8 lb

## 2020-08-27 DIAGNOSIS — G3184 Mild cognitive impairment, so stated: Secondary | ICD-10-CM

## 2020-08-27 DIAGNOSIS — G40209 Localization-related (focal) (partial) symptomatic epilepsy and epileptic syndromes with complex partial seizures, not intractable, without status epilepticus: Secondary | ICD-10-CM
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
## 2020-09-08 DIAGNOSIS — K219 Gastro-esophageal reflux disease without esophagitis: Secondary | ICD-10-CM | POA: Insufficient documentation

## 2020-10-03 ENCOUNTER — Ambulatory Visit: Payer: Self-pay | Admitting: General Surgery

## 2020-10-11 NOTE — Patient Instructions (Addendum)
DUE TO COVID-19 ONLY ONE VISITOR IS ALLOWED TO COME WITH YOU AND STAY IN THE WAITING ROOM ONLY DURING PRE OP AND PROCEDURE DAY OF SURGERY. THE 1 VISITOR  MAY VISIT WITH YOU AFTER SURGERY IN YOUR PRIVATE ROOM DURING VISITING HOURS ONLY!  YOU NEED TO HAVE A COVID 19 TEST ON_4/7______ @_12 :55______, THIS TEST MUST BE DONE BEFORE SURGERY,  COVID TESTING SITE [place] [id number], IT IS ON THE RIGHT GOING OUT [address] APPROXIMATELY  2 MINUTES PAST [employer] ON THE RIGHT. ONCE YOUR COVID TEST IS COMPLETED,  PLEASE BEGIN THE QUARANTINE INSTRUCTIONS AS OUTLINED IN YOUR HANDOUT.                Kenyetta Fife [place]    Your procedure is scheduled on: 10/22/20   Report to [facility] Main  Entrance   Report to admitting at  2:30 PM      Call this number if you have problems the morning of surgery [place], NO [place].  No food after midnight.    You may have clear liquid until 1:30 PM.     CLEAR LIQUID DIET   Foods Allowed                                                                     Foods Excluded  Coffee and tea, regular and decaf                             liquids that you cannot  Plain Jell-O any favor except red or purple                                           see through such as: Fruit ices (not with fruit pulp)                                     milk, soups, orange juice  Iced Popsicles                                    All solid food Carbonated beverages, regular and diet                                    Cranberry, grape and apple juices Sports drinks like Gatorade Lightly seasoned clear broth or consume(fat free) Sugar, honey syrup      At 1:00 PM drink pre surgery drink  . Nothing by mouth after 1:30 PM.    Take these medicines the morning of surgery with A SIP OF WATER: Depakote, Prozac, Amlodipine, Tamsulosin, Oxybutynin  You may not have any metal on your body including              piercings  Do not wear jewelry,  lotions, powders or deodorant              Men may shave face and neck.   Do not bring valuables to the hospital. [place].  Contacts, dentures or bridgework may not be worn into surgery.      Patients discharged the day of surgery will not be allowed to drive home.   IF YOU ARE HAVING SURGERY AND GOING HOME THE SAME DAY, YOU MUST HAVE AN ADULT TO DRIVE YOU HOME AND BE WITH YOU FOR 24 HOURS.  YOU MAY GO HOME BY TAXI OR UBER OR ORTHERWISE, BUT AN ADULT MUST ACCOMPANY YOU HOME AND STAY WITH YOU FOR 24 HOURS.  Name and phone number of your driver:  Special Instructions: N/A              Please read over the following fact sheets you were given: _____________________________________________________________________             [facility] - Preparing for Surgery Before surgery, you can play an important role.  Because skin is not sterile, your skin needs to be as free of germs as possible.  You can reduce the number of germs on your skin by washing with CHG (chlorahexidine gluconate) soap before surgery.  CHG is an antiseptic cleaner which kills germs and bonds with the skin to continue killing germs even after washing. Please DO NOT use if you have an allergy to CHG or antibacterial soaps.  If your skin becomes reddened/irritated stop using the CHG and inform your nurse when you arrive at Short Stay.    You may shave your face/neck. Please follow these instructions carefully:  1.  Shower with CHG Soap the night before surgery and the  morning of Surgery.  2.  If you choose to wash your hair, wash your hair first as usual with your  normal  shampoo.  3.  After you shampoo, rinse your hair and body thoroughly to remove the  shampoo.                                        4.  Use CHG as you would any other liquid soap.   You can apply chg directly  to the skin and wash                       Gently with a scrungie or clean washcloth.  5.  Apply the CHG Soap to your body ONLY FROM THE NECK DOWN.   Do not use on face/ open                           Wound or open sores. Avoid contact with eyes, ears mouth and genitals (private parts).                       Wash face,  Genitals (private parts) with your normal soap.             6.  Wash thoroughly, paying special attention to the area where your  surgery  will be performed.  7.  Thoroughly rinse your body with warm water from the neck down.  8.  DO NOT shower/wash with your normal soap after using and rinsing off  the CHG Soap.             9.  Pat yourself dry with a clean towel.            10.  Wear clean pajamas.            11.  Place clean sheets on your bed the night of your first shower and do not  sleep with pets. Day of Surgery : Do not apply any lotions/deodorants the morning of surgery.  Please wear clean clothes to the hospital/surgery center.  FAILURE TO FOLLOW THESE INSTRUCTIONS MAY RESULT IN THE CANCELLATION OF YOUR SURGERY PATIENT SIGNATURE_________________________________  NURSE SIGNATURE__________________________________  ________________________________________________________________________

## 2020-10-16 ENCOUNTER — Encounter (HOSPITAL_COMMUNITY): Payer: Self-pay

## 2020-10-16 ENCOUNTER — Encounter (HOSPITAL_COMMUNITY)
Admission: RE | Admit: 2020-10-16 | Discharge: 2020-10-16 | Disposition: A | Discharge status: Home / Self Care | Payer: Medicare PPO | Source: Ambulatory Visit | Attending: General Surgery | Admitting: General Surgery

## 2020-10-16 ENCOUNTER — Other Ambulatory Visit: Payer: Self-pay

## 2020-10-16 DIAGNOSIS — Z01812 Encounter for preprocedural laboratory examination: Secondary | ICD-10-CM | POA: Insufficient documentation

## 2020-10-16 LAB — CBC
HCT: 37.7 % — ABNORMAL LOW (ref 39.0–52.0)
Hemoglobin: 12.6 g/dL — ABNORMAL LOW (ref 13.0–17.0)
MCH: 31.8 pg (ref 26.0–34.0)
MCHC: 33.4 g/dL (ref 30.0–36.0)
MCV: 95.2 fL (ref 80.0–100.0)
Platelets: 129 10*3/uL — ABNORMAL LOW (ref 150–400)
RBC: 3.96 MIL/uL — ABNORMAL LOW (ref 4.22–5.81)
RDW: 12.5 % (ref 11.5–15.5)
WBC: 4.8 10*3/uL (ref 4.0–10.5)
nRBC: 0 % (ref 0.0–0.2)

## 2020-10-16 LAB — BASIC METABOLIC PANEL
Anion gap: 7 (ref 5–15)
BUN: 20 mg/dL (ref 8–23)
CO2: 29 mmol/L (ref 22–32)
Calcium: 9.1 mg/dL (ref 8.9–10.3)
Chloride: 99 mmol/L (ref 98–111)
Creatinine, Ser: 1.05 mg/dL (ref 0.61–1.24)
GFR, Estimated: >60 mL/min (ref >60)
Glucose, Bld: 102 mg/dL — ABNORMAL HIGH (ref 70–99)
Potassium: 4.6 mmol/L (ref 3.5–5.1)
Sodium: 135 mmol/L (ref 135–145)
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
## 2020-10-18 ENCOUNTER — Other Ambulatory Visit (HOSPITAL_COMMUNITY)
Admission: RE | Admit: 2020-10-18 | Discharge: 2020-10-18 | Disposition: A | Discharge status: Home / Self Care | Payer: Medicare PPO | Source: Ambulatory Visit | Attending: General Surgery | Admitting: General Surgery

## 2020-10-18 DIAGNOSIS — Z01812 Encounter for preprocedural laboratory examination: Secondary | ICD-10-CM | POA: Insufficient documentation

## 2020-10-18 DIAGNOSIS — Z20822 Contact with and (suspected) exposure to covid-19: Secondary | ICD-10-CM | POA: Diagnosis not present

## 2020-10-18 LAB — SARS CORONAVIRUS 2 (TAT 6-24 HRS): SARS Coronavirus 2: NEGATIVE

## 2020-10-22 ENCOUNTER — Ambulatory Visit (HOSPITAL_COMMUNITY)
Admission: RE | Admit: 2020-10-22 | Discharge: 2020-10-22 | Disposition: A | Discharge status: Home / Self Care | Payer: Medicare PPO | Attending: General Surgery | Admitting: General Surgery

## 2020-10-22 ENCOUNTER — Encounter (HOSPITAL_COMMUNITY)
Admission: RE | Disposition: A | Discharge status: Home / Self Care | Payer: Self-pay | Source: Home / Self Care | Attending: General Surgery

## 2020-10-22 ENCOUNTER — Ambulatory Visit (HOSPITAL_COMMUNITY): Payer: Medicare PPO | Admitting: Certified Registered Nurse Anesthetist

## 2020-10-22 ENCOUNTER — Encounter (HOSPITAL_COMMUNITY): Payer: Self-pay | Admitting: General Surgery

## 2020-10-22 DIAGNOSIS — Z87442 Personal history of urinary calculi: Secondary | ICD-10-CM | POA: Insufficient documentation

## 2020-10-22 DIAGNOSIS — I10 Essential (primary) hypertension: Secondary | ICD-10-CM | POA: Diagnosis not present

## 2020-10-22 DIAGNOSIS — Z79899 Other long term (current) drug therapy: Secondary | ICD-10-CM | POA: Diagnosis not present

## 2020-10-22 DIAGNOSIS — Z820 Family history of epilepsy and other diseases of the nervous system: Secondary | ICD-10-CM | POA: Insufficient documentation

## 2020-10-22 DIAGNOSIS — K4090 Unilateral inguinal hernia, without obstruction or gangrene, not specified as recurrent: Secondary | ICD-10-CM | POA: Insufficient documentation

## 2020-10-22 HISTORY — PX: INGUINAL HERNIA REPAIR: SHX194

## 2020-10-22 SURGERY — REPAIR, HERNIA, INGUINAL, ADULT
Anesthesia: General | Laterality: Left

## 2020-10-22 MED ORDER — PROPOFOL 10 MG/ML IV BOLUS
INTRAVENOUS | Status: DC | PRN
  Administered 2020-10-22: 150 mg via INTRAVENOUS

## 2020-10-22 MED ORDER — FENTANYL CITRATE (PF) 250 MCG/5ML IJ SOLN
INTRAMUSCULAR | Status: AC
  Filled 2020-10-22: qty 5

## 2020-10-22 MED ORDER — FENTANYL CITRATE (PF) 100 MCG/2ML IJ SOLN
INTRAMUSCULAR | Status: AC
  Filled 2020-10-22: qty 2

## 2020-10-22 MED ORDER — FENTANYL CITRATE (PF) 100 MCG/2ML IJ SOLN
50.0000 ug | Freq: Once | INTRAMUSCULAR | Status: AC
  Administered 2020-10-22: 50 ug via INTRAVENOUS

## 2020-10-22 MED ORDER — FENTANYL CITRATE (PF) 100 MCG/2ML IJ SOLN
INTRAMUSCULAR | Status: DC | PRN
  Administered 2020-10-22: 50 ug via INTRAVENOUS

## 2020-10-22 MED ORDER — BUPIVACAINE HCL 0.5 % IJ SOLN
INTRAMUSCULAR | Status: DC | PRN
  Administered 2020-10-22: 20 mL

## 2020-10-22 MED ORDER — DEXAMETHASONE SODIUM PHOSPHATE 10 MG/ML IJ SOLN
INTRAMUSCULAR | Status: DC | PRN
  Administered 2020-10-22: 8 mg via INTRAVENOUS

## 2020-10-22 MED ORDER — BUPIVACAINE LIPOSOME 1.3 % IJ SUSP
20.0000 mL | Freq: Once | INTRAMUSCULAR | Status: AC
  Administered 2020-10-22: 20 mL
  Filled 2020-10-22: qty 20

## 2020-10-22 MED ORDER — 0.9 % SODIUM CHLORIDE (POUR BTL) OPTIME
TOPICAL | Status: DC | PRN
  Administered 2020-10-22: 1000 mL

## 2020-10-22 MED ORDER — CHLORHEXIDINE GLUCONATE 0.12 % MT SOLN
15.0000 mL | Freq: Once | OROMUCOSAL | Status: AC
  Administered 2020-10-22: 15 mL via OROMUCOSAL

## 2020-10-22 MED ORDER — CELECOXIB 200 MG PO CAPS
400.0000 mg | ORAL_CAPSULE | ORAL | Status: AC
  Administered 2020-10-22: 400 mg via ORAL
  Filled 2020-10-22: qty 2

## 2020-10-22 MED ORDER — LACTATED RINGERS IV SOLN: INTRAVENOUS | Status: DC

## 2020-10-22 MED ORDER — PHENYLEPHRINE HCL (PRESSORS) 10 MG/ML IV SOLN
INTRAVENOUS | Status: AC
  Filled 2020-10-22: qty 1

## 2020-10-22 MED ORDER — DEXAMETHASONE SODIUM PHOSPHATE 10 MG/ML IJ SOLN
INTRAMUSCULAR | Status: DC | PRN
  Administered 2020-10-22: 10 mg

## 2020-10-22 MED ORDER — ACETAMINOPHEN 10 MG/ML IV SOLN: 1000.0000 mg | Freq: Once | INTRAVENOUS | Status: DC | PRN

## 2020-10-22 MED ORDER — CHLORHEXIDINE GLUCONATE CLOTH 2 % EX PADS: 6.0000 | MEDICATED_PAD | Freq: Once | CUTANEOUS | Status: DC

## 2020-10-22 MED ORDER — OXYCODONE HCL 5 MG PO TABS: 5.0000 mg | ORAL_TABLET | Freq: Four times a day (QID) | ORAL | 0 refills | Status: DC | PRN

## 2020-10-22 MED ORDER — BUPIVACAINE HCL (PF) 0.5 % IJ SOLN
INTRAMUSCULAR | Status: AC
  Filled 2020-10-22: qty 30

## 2020-10-22 MED ORDER — CEFAZOLIN SODIUM-DEXTROSE 2-4 GM/100ML-% IV SOLN
2.0000 g | INTRAVENOUS | Status: AC
  Administered 2020-10-22: 2 g via INTRAVENOUS
  Filled 2020-10-22: qty 100

## 2020-10-22 MED ORDER — EPHEDRINE SULFATE-NACL 50-0.9 MG/10ML-% IV SOSY
PREFILLED_SYRINGE | INTRAVENOUS | Status: DC | PRN
  Administered 2020-10-22: 10 mg via INTRAVENOUS
  Administered 2020-10-22: 5 mg via INTRAVENOUS

## 2020-10-22 MED ORDER — DEXAMETHASONE SODIUM PHOSPHATE 10 MG/ML IJ SOLN
INTRAMUSCULAR | Status: AC
  Filled 2020-10-22: qty 1

## 2020-10-22 MED ORDER — ROCURONIUM BROMIDE 10 MG/ML (PF) SYRINGE
PREFILLED_SYRINGE | INTRAVENOUS | Status: AC
  Filled 2020-10-22: qty 10

## 2020-10-22 MED ORDER — GLYCOPYRROLATE PF 0.2 MG/ML IJ SOSY
PREFILLED_SYRINGE | INTRAMUSCULAR | Status: AC
  Filled 2020-10-22: qty 1

## 2020-10-22 MED ORDER — ACETAMINOPHEN 500 MG PO TABS
1000.0000 mg | ORAL_TABLET | ORAL | Status: AC
  Administered 2020-10-22: 1000 mg via ORAL
  Filled 2020-10-22: qty 2

## 2020-10-22 MED ORDER — SUCCINYLCHOLINE CHLORIDE 200 MG/10ML IV SOSY
PREFILLED_SYRINGE | INTRAVENOUS | Status: AC
  Filled 2020-10-22: qty 10

## 2020-10-22 MED ORDER — CLONIDINE HCL (ANALGESIA) 100 MCG/ML EP SOLN
EPIDURAL | Status: DC | PRN
  Administered 2020-10-22: 100 ug

## 2020-10-22 MED ORDER — ORAL CARE MOUTH RINSE: 15.0000 mL | Freq: Once | OROMUCOSAL | Status: AC

## 2020-10-22 MED ORDER — ROCURONIUM BROMIDE 10 MG/ML (PF) SYRINGE
PREFILLED_SYRINGE | INTRAVENOUS | Status: DC | PRN
  Administered 2020-10-22: 50 mg via INTRAVENOUS
  Administered 2020-10-22: 10 mg via INTRAVENOUS

## 2020-10-22 MED ORDER — GLYCOPYRROLATE PF 0.2 MG/ML IJ SOSY
PREFILLED_SYRINGE | INTRAMUSCULAR | Status: DC | PRN
  Administered 2020-10-22: .2 mg via INTRAVENOUS

## 2020-10-22 MED ORDER — SUGAMMADEX SODIUM 200 MG/2ML IV SOLN
INTRAVENOUS | Status: DC | PRN
  Administered 2020-10-22: 155.2 mg via INTRAVENOUS

## 2020-10-22 MED ORDER — ENSURE PRE-SURGERY PO LIQD
296.0000 mL | Freq: Once | ORAL | Status: DC
  Filled 2020-10-22: qty 296

## 2020-10-22 MED ORDER — LIDOCAINE 2% (20 MG/ML) 5 ML SYRINGE
INTRAMUSCULAR | Status: AC
  Filled 2020-10-22: qty 5

## 2020-10-22 MED ORDER — BUPIVACAINE HCL 0.5 % IJ SOLN
INTRAMUSCULAR | Status: DC | PRN
  Administered 2020-10-22 (×6): 5 mL

## 2020-10-22 MED ORDER — ONDANSETRON HCL 4 MG/2ML IJ SOLN
INTRAMUSCULAR | Status: DC | PRN
  Administered 2020-10-22: 4 mg via INTRAVENOUS

## 2020-10-22 MED ORDER — LIDOCAINE 2% (20 MG/ML) 5 ML SYRINGE
INTRAMUSCULAR | Status: DC | PRN
  Administered 2020-10-22: 40 mg via INTRAVENOUS

## 2020-10-22 MED ORDER — EPHEDRINE 5 MG/ML INJ
INTRAVENOUS | Status: AC
  Filled 2020-10-22: qty 10

## 2020-10-22 MED ORDER — FENTANYL CITRATE (PF) 100 MCG/2ML IJ SOLN: 25.0000 ug | INTRAMUSCULAR | Status: DC | PRN

## 2020-10-22 MED ORDER — ONDANSETRON HCL 4 MG/2ML IJ SOLN: 4.0000 mg | Freq: Once | INTRAMUSCULAR | Status: DC | PRN

## 2020-10-22 MED ORDER — ONDANSETRON HCL 4 MG/2ML IJ SOLN
INTRAMUSCULAR | Status: AC
  Filled 2020-10-22: qty 2

## 2020-10-22 MED ORDER — PROPOFOL 10 MG/ML IV BOLUS
INTRAVENOUS | Status: AC
  Filled 2020-10-22: qty 20

## 2020-10-22 SURGICAL SUPPLY — 50 items
ADH SKN CLS APL DERMABOND .7 (GAUZE/BANDAGES/DRESSINGS) ×1
APL PRP STRL LF DISP 70% ISPRP (MISCELLANEOUS) ×1
APL SKNCLS STERI-STRIP NONHPOA (GAUZE/BANDAGES/DRESSINGS) ×1
BENZOIN TINCTURE PRP APPL 2/3 (GAUZE/BANDAGES/DRESSINGS) ×2 IMPLANT
BLADE SURG 15 STRL LF DISP TIS (BLADE) ×1 IMPLANT
BLADE SURG 15 STRL SS (BLADE) ×2
CELLS DAT CNTRL 66122 CELL SVR (MISCELLANEOUS) IMPLANT
CHLORAPREP W/TINT 26 (MISCELLANEOUS) ×2 IMPLANT
COVER SURGICAL LIGHT HANDLE (MISCELLANEOUS) ×2 IMPLANT
COVER WAND RF STERILE (DRAPES) ×2 IMPLANT
DECANTER SPIKE VIAL GLASS SM (MISCELLANEOUS) ×2 IMPLANT
DERMABOND ADVANCED (GAUZE/BANDAGES/DRESSINGS) ×1
DERMABOND ADVANCED .7 DNX12 (GAUZE/BANDAGES/DRESSINGS) IMPLANT
DRAIN PENROSE 0.5X18 (DRAIN) IMPLANT
DRAPE LAPAROTOMY TRNSV 102X78 (DRAPES) ×2 IMPLANT
DRAPE UTILITY 15X26 TOWEL STRL (DRAPES) ×2 IMPLANT
DRSG TEGADERM 4X4.75 (GAUZE/BANDAGES/DRESSINGS) IMPLANT
DRSG TELFA PLUS 4X6 ADH ISLAND (GAUZE/BANDAGES/DRESSINGS) ×2 IMPLANT
ELECT REM PT RETURN 15FT ADLT (MISCELLANEOUS) ×2 IMPLANT
GAUZE SPONGE 4X4 12PLY STRL (GAUZE/BANDAGES/DRESSINGS) IMPLANT
GLOVE SURG POLYISO LF SZ7 (GLOVE) ×2 IMPLANT
GLOVE SURG UNDER POLY LF SZ7 (GLOVE) IMPLANT
GOWN STRL REUS W/TWL LRG LVL3 (GOWN DISPOSABLE) ×2 IMPLANT
GOWN STRL REUS W/TWL XL LVL3 (GOWN DISPOSABLE) ×2 IMPLANT
KIT BASIN OR (CUSTOM PROCEDURE TRAY) ×2 IMPLANT
KIT TURNOVER KIT A (KITS) ×2 IMPLANT
MESH HERNIA 3X6 (Mesh General) ×1 IMPLANT
NEEDLE HYPO 22GX1.5 SAFETY (NEEDLE) ×2 IMPLANT
PACK BASIC VI WITH GOWN DISP (CUSTOM PROCEDURE TRAY) ×2 IMPLANT
PENCIL SMOKE EVACUATOR (MISCELLANEOUS) IMPLANT
RETRACTOR WND ALEXIS 18 MED (MISCELLANEOUS) IMPLANT
RETRACTOR WND ALEXIS 25 LRG (MISCELLANEOUS) IMPLANT
RTRCTR WOUND ALEXIS 18CM MED (MISCELLANEOUS)
RTRCTR WOUND ALEXIS 25CM LRG (MISCELLANEOUS)
SPONGE LAP 18X18 RF (DISPOSABLE) IMPLANT
SPONGE LAP 4X18 RFD (DISPOSABLE) ×2 IMPLANT
STRIP CLOSURE SKIN 1/2X4 (GAUZE/BANDAGES/DRESSINGS) IMPLANT
SUT MNCRL AB 4-0 PS2 18 (SUTURE) ×2 IMPLANT
SUT PDS AB 2-0 CT2 27 (SUTURE) ×2 IMPLANT
SUT PROLENE 2 0 CT2 30 (SUTURE) ×6 IMPLANT
SUT VIC AB 2-0 CT1 27 (SUTURE) ×2
SUT VIC AB 2-0 CT1 TAPERPNT 27 (SUTURE) ×1 IMPLANT
SUT VIC AB 3-0 SH 18 (SUTURE) ×2 IMPLANT
SUT VIC AB 3-0 SH 27 (SUTURE) ×4
SUT VIC AB 3-0 SH 27XBRD (SUTURE) ×2 IMPLANT
SYR BULB IRRIG 60ML STRL (SYRINGE) ×2 IMPLANT
SYR CONTROL 10ML LL (SYRINGE) ×2 IMPLANT
TOWEL OR 17X26 10 PK STRL BLUE (TOWEL DISPOSABLE) ×2 IMPLANT
TOWEL OR NON WOVEN STRL DISP B (DISPOSABLE) ×2 IMPLANT
YANKAUER SUCT BULB TIP 10FT TU (MISCELLANEOUS) IMPLANT

## 2020-10-22 NOTE — Transfer of Care (Signed)
Immediate Anesthesia Transfer of Care Note  Patient: Bill Fry  Procedure(s) Performed: OPEN HERNIA REPAIR INGUINAL ADULT WITH MESH (Left )  Patient Location: PACU  Anesthesia Type:GA combined with regional for post-op pain  Level of Consciousness: drowsy and patient cooperative  Airway & Oxygen Therapy: Patient Spontanous Breathing and Patient connected to face mask oxygen  Post-op Assessment: Report given to RN and Post -op Vital signs reviewed and stable  Post vital signs: Reviewed and stable  Last Vitals:  Vitals Value Taken Time  BP 155/77 10/22/20 1707  Temp    Pulse 62 10/22/20 1709  Resp 20 10/22/20 1709  SpO2 100 % 10/22/20 1709  Vitals shown include unvalidated device data.  Last Pain:  Vitals:   10/22/20 1520  TempSrc:   PainSc: Asleep      Patients Stated Pain Goal: 3 (15/04/13 6438)  Complications: No complications documented.

## 2020-10-22 NOTE — Op Note (Signed)
Preop diagnosis: left inguinal hernia  Postop diagnosis: left indirect inguinal hernia  Procedure: open Left inguinal hernia repair with mesh  Surgeon: Gurney Maxin, M.D.  Asst: none  Anesthesia: Gen.   Indications for procedure: Bill Fry is a 80 y.o. male with symptoms of pain and enlarging Left inguinal hernia(s). After discussing risks, alternatives and benefits he decided on open repair and was brought to day surgery for repair.  Description of procedure: The patient was brought into the operative suite, placed supine. Anesthesia was administered with endotracheal tube. Patient was strapped in place. The patient was prepped and draped in the usual sterile fashion.  The anterior superior iliac spine and pubic tubercle were identified on the Left side. An incision was made 1cm above the connecting line, representative of the location of the inguinal ligament. The subcutaneous tissue was bluntly dissected, scarpa's fascia was dissected away. The external abdominal oblique fascia was identified and sharply opened down to the external inguinal ring. The conjoint tendon and inguinal ligament were identified. The cord structures and sac were dissected free of the surrounding tissue in 360 degrees. A penrose drain was used to encircle the contents. The cremasteric fibers were dissected free of the contents of the cord and hernia sac. The cord structures (vessels and vas deferens) were identified and carefully dissected away from the hernia sac. The hernia sac was reduced and contained no visceral structures.The hernia sac was dissected down to the internal inguinal ring. Preperitoneal fat was identified showing appropriate dissection. The sac was then reduced into the preperitoneal space. The hernia was indirect. A 3x6 Bard mesh was then used to close the defect and reinforce the floor. The mesh was sutured to the lacunar ligament and inguinal ligament using a 2-0 prolene in running fashion. Next  the superior edge of the mesh was sutured to the conjoined tendon using a 2-0 running Prolene. An additional 2-0 Prolene was used to suture the tail ends of the mesh together re-creating the deep ring. Cord structures are running in a neutral position through the mesh. Next the external abdominal oblique fascia was closed with a 2-0 Vicryl in interrupted fashion to re-create the external inguinal ring. Scarpa's fascia was closed with 3-0 Vicryl in running fashion. Skin was closed with a 4-0 Monocryl subcuticular stitch in running fashion. Dermabond place for dressing. Patient woke from anesthesia and brought to PACU in stable condition. All counts are correct.    Findings: left inguinal hernia  Specimen: none  Blood loss: 10 ml  Local anesthesia: 40 ml Marcaine/Exparel mix  Complications: none  Implant: 8 x 15 cm Bard mesh  Gurney Maxin, M.D. General, Bariatric, & Minimally Invasive Surgery [facility], [place] 4:53 PM 10/22/2020

## 2020-10-22 NOTE — Discharge Instructions (Signed)

## 2020-10-22 NOTE — Anesthesia Preprocedure Evaluation (Addendum)
Anesthesia Evaluation  Patient identified by MRN, date of birth, ID band Patient awake    Reviewed: Allergy & Precautions, NPO status , Patient's Chart, lab work & pertinent test results  Airway Mallampati: I       Dental no notable dental hx.    Pulmonary neg pulmonary ROS,    Pulmonary exam normal        Cardiovascular hypertension, Pt. on medications Normal cardiovascular exam     Neuro/Psych PSYCHIATRIC DISORDERS Anxiety Depression    GI/Hepatic negative GI ROS, Neg liver ROS,   Endo/Other  negative endocrine ROS  Renal/GU Renal disease     Musculoskeletal   Abdominal Normal abdominal exam  (+)   Peds  Hematology  (+) Blood dyscrasia, anemia ,   Anesthesia Other Findings   Reproductive/Obstetrics                             Anesthesia Physical Anesthesia Plan  ASA: II  Anesthesia Plan: General   Post-op Pain Management:  Regional for Post-op pain   Induction:   PONV Risk Score and Plan: 3 and Ondansetron and Dexamethasone  Airway Management Planned: Oral ETT  Additional Equipment: None  Intra-op Plan:   Post-operative Plan: Extubation in OR  Informed Consent: I have reviewed the patients History and Physical, chart, labs and discussed the procedure including the risks, benefits and alternatives for the proposed anesthesia with the patient or authorized representative who has indicated his/her understanding and acceptance.     Dental advisory given  Plan Discussed with: CRNA and Anesthesiologist  Anesthesia Plan Comments:        Anesthesia Quick Evaluation

## 2020-10-22 NOTE — Anesthesia Postprocedure Evaluation (Signed)
Anesthesia Post Note  Patient: Bill Fry  Procedure(s) Performed: OPEN HERNIA REPAIR INGUINAL ADULT WITH MESH (Left )     Patient location during evaluation: PACU Anesthesia Type: General Level of consciousness: sedated Pain management: pain level controlled Vital Signs Assessment: post-procedure vital signs reviewed and stable Respiratory status: spontaneous breathing Cardiovascular status: stable Postop Assessment: no apparent nausea or vomiting Anesthetic complications: no   No complications documented.  Last Vitals:  Vitals:   10/22/20 1715 10/22/20 1730  BP: 134/83 129/71  Pulse: 61 62  Resp: (!) 27 11  Temp: (!) 36.4 C   SpO2: 99% 100%    Last Pain:  Vitals:   10/22/20 1715  TempSrc:   PainSc: 0-No pain                 Huston Foley

## 2020-10-22 NOTE — Progress Notes (Signed)
AssistedDr. Hatchett with left, ultrasound guided, transabdominal plane block. Side rails up, monitors on throughout procedure. See vital signs in flow sheet. Tolerated Procedure well.  

## 2020-10-22 NOTE — H&P (Signed)
Bill Fry is an 80 y.o. male.   Chief Complaint: hernia HPI: 80 yo male with left inguinal hernia. Hernia causes pain and is able to reduce in supine position.  Past Medical History:  Diagnosis Date  . Anxiety and depression   . Basal cell carcinoma    nose  . Bladder outlet obstruction   . Cyst of right kidney   . History of kidney stones   . Hypertension   . Seizure-like activity ([place])   . 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 INGUINAL HERNIA REPAIR    . TONSILLECTOMY      Family History  Problem Relation Age of Onset  . Dementia Mother        "old age" - lived to 69  . Other Father        lived to 66-87 - unsure of medical history   Social History:  reports that he has never smoked. He has never used smokeless tobacco. He reports current alcohol use. He reports that he does not use drugs.  Allergies: No Known Allergies  Medications Prior to Admission  Medication Sig Dispense Refill  . amLODipine (NORVASC) 5 MG tablet Take 5 mg by mouth daily.    . divalproex (DEPAKOTE ER) 500 MG 24 hr tablet Take 2 tablets (1,000 mg total) by mouth at bedtime. 180 tablet 4  . FLUoxetine (PROZAC) 20 MG capsule Take 20 mg by mouth daily.    . hydrochlorothiazide (MICROZIDE) 12.5 MG capsule Take 12.5 mg by mouth daily.    Bill Kitchen oxybutynin (DITROPAN-XL) 5 MG 24 hr tablet Take 5 mg by mouth at bedtime.    . SUPER B COMPLEX/C PO Take 1 tablet by mouth daily.    . tamsulosin (FLOMAX) 0.4 MG CAPS capsule Take 0.4 mg by mouth daily.      No results found for this or any previous visit (from the past 48 hour(s)). No results found.  Review of Systems  Constitutional: Negative for chills and fever.  HENT: Negative for hearing loss.   Respiratory: Negative for cough.   Cardiovascular: Negative for chest pain and palpitations.  Gastrointestinal: Negative for abdominal pain, nausea and vomiting.  Genitourinary: Negative for  dysuria and urgency.  Musculoskeletal: Negative for myalgias and neck pain.  Skin: Negative for rash.  Neurological: Negative for dizziness and headaches.  Hematological: Does not bruise/bleed easily.  Psychiatric/Behavioral: Negative for suicidal ideas.    Blood pressure (!) 150/77, pulse 62, temperature 98 F (36.7 C), temperature source Oral, resp. rate 20, SpO2 100 %. Physical Exam Vitals reviewed.  Constitutional:      Appearance: He is well-developed.  HENT:     Head: Normocephalic and atraumatic.  Eyes:     Conjunctiva/sclera: Conjunctivae normal.     Pupils: Pupils are equal, round, and reactive to light.  Cardiovascular:     Rate and Rhythm: Normal rate and regular rhythm.  Pulmonary:     Effort: Pulmonary effort is normal.     Breath sounds: Normal breath sounds.  Abdominal:     General: Bowel sounds are normal. There is no distension.     Palpations: Abdomen is soft.     Tenderness: There is no abdominal tenderness.     Comments: Left inguinal hernia  Musculoskeletal:        General: Normal range of motion.     Cervical back: Normal range of motion and neck supple.  Skin:    General: Skin is warm and dry.  Neurological:     Mental Status: He is alert and oriented to person, place, and time.  Psychiatric:        Behavior: Behavior normal.      Assessment/Plan 80 yo male with left inguinal hernia -left open inguinal hernia repair with mesh -ERAs protocol -planned outpatient procedure  Mickeal Skinner, MD 10/22/2020, 3:05 PM

## 2020-10-22 NOTE — Anesthesia Procedure Notes (Signed)
Anesthesia Regional Block: TAP block   Pre-Anesthetic Checklist: ,, timeout performed, Correct Patient, Correct Site, Correct Laterality, Correct Procedure, Correct Position, site marked, Risks and benefits discussed,  Surgical consent,  Pre-op evaluation,  At surgeon's request and post-op pain management  Laterality: Left and N/A  Prep: chloraprep       Needles:  Injection technique: Single-shot  Needle Type: Echogenic Stimulator Needle     Needle Length: 9cm  Needle Gauge: 20   Needle insertion depth: 2 cm   Additional Needles:   Procedures:,,,, ultrasound used (permanent image in chart),,,,  Narrative:  Start time: 10/22/2020 3:10 PM End time: 10/22/2020 3:18 PM Injection made incrementally with aspirations every 5 mL.  Performed by: Personally  Anesthesiologist: Lyn Hollingshead, MD

## 2020-10-22 NOTE — Anesthesia Procedure Notes (Signed)
Procedure Name: Intubation Date/Time: 10/22/2020 3:53 PM Performed by: Montel Clock, CRNA Pre-anesthesia Checklist: Patient identified, Emergency Drugs available, Suction available, Patient being monitored and Timeout performed Patient Re-evaluated:Patient Re-evaluated prior to induction Oxygen Delivery Method: Circle system utilized Preoxygenation: Pre-oxygenation with 100% oxygen Induction Type: IV induction Ventilation: Mask ventilation without difficulty and Oral airway inserted - appropriate to patient size Laryngoscope Size: Mac and 3 Grade View: Grade I Tube type: Oral Tube size: 7.5 mm Number of attempts: 1 Airway Equipment and Method: Stylet Placement Confirmation: ETT inserted through vocal cords under direct vision,  positive ETCO2 and breath sounds checked- equal and bilateral Secured at: 23 cm Tube secured with: Tape Dental Injury: Teeth and Oropharynx as per pre-operative assessment

## 2020-10-23 ENCOUNTER — Encounter (HOSPITAL_COMMUNITY): Payer: Self-pay | Admitting: General Surgery

## 2021-03-13 ENCOUNTER — Other Ambulatory Visit: Payer: Self-pay | Admitting: Neurology

## 2021-04-16 ENCOUNTER — Telehealth: Payer: Self-pay | Admitting: Neurology

## 2021-04-16 NOTE — Telephone Encounter (Signed)
Returned patient's call.  Advised patient since he wasn't sure if he actually missed the dose, it would be better to not take any medication this morning, as he should have some of the medicine in his body still, and better than taking it and overdosing on it.  Did advise, he could take his evenings dose a little earlier today only, and then tomorrow be back on his routine schedule.  Patient denied further questions, verbalized understanding and expressed appreciation for the phone call.

## 2021-04-16 NOTE — Telephone Encounter (Signed)
Pt called says he can't remember if he took his divalproex (DEPAKOTE ER) 500 MG 24 hr tablet last night. He says he normally keeps a tally every night but he said he didn't check off last night, pt also says his memory is not the greatest. Wants to know what he should do, wait until bedtime tonight and take them or maybe take one now and one later. Pt requesting a call back.

## 2021-04-22 ENCOUNTER — Ambulatory Visit: Payer: Medicare PPO | Attending: Internal Medicine

## 2021-04-22 DIAGNOSIS — Z23 Encounter for immunization: Secondary | ICD-10-CM

## 2021-04-22 NOTE — Progress Notes (Signed)
   Covid-19 Vaccination Clinic  Name:  Bill Fry    MRN: [id number] DOB: [birth year]  04/22/2021  [name] was observed post Covid-19 immunization for 15 minutes without incident. He was provided with Vaccine Information Sheet and instruction to access the V-Safe system.   [name] was instructed to call 911 with any severe reactions post vaccine: Difficulty breathing  Swelling of face and throat  A fast heartbeat  A bad rash all over body  Dizziness and weakness

## 2021-04-26 ENCOUNTER — Ambulatory Visit (INDEPENDENT_AMBULATORY_CARE_PROVIDER_SITE_OTHER): Payer: Medicare PPO | Admitting: Psychiatry

## 2021-04-26 ENCOUNTER — Other Ambulatory Visit: Payer: Self-pay

## 2021-04-26 DIAGNOSIS — G40909 Epilepsy, unspecified, not intractable, without status epilepticus: Secondary | ICD-10-CM | POA: Diagnosis not present

## 2021-04-26 DIAGNOSIS — F4321 Adjustment disorder with depressed mood: Secondary | ICD-10-CM
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
## 2021-06-12 ENCOUNTER — Other Ambulatory Visit: Payer: Self-pay

## 2021-06-12 ENCOUNTER — Ambulatory Visit (INDEPENDENT_AMBULATORY_CARE_PROVIDER_SITE_OTHER): Payer: Medicare PPO | Admitting: Psychiatry

## 2021-06-12 DIAGNOSIS — F4321 Adjustment disorder with depressed mood: Secondary | ICD-10-CM

## 2021-06-12 DIAGNOSIS — F40233 Fear of injury: Secondary | ICD-10-CM

## 2021-06-12 DIAGNOSIS — Z630 Problems in relationship with spouse or partner: Secondary | ICD-10-CM

## 2021-06-12 DIAGNOSIS — G40909 Epilepsy, unspecified, not intractable, without status epilepticus: Secondary | ICD-10-CM | POA: Diagnosis not present
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
## 2021-07-11 ENCOUNTER — Ambulatory Visit (INDEPENDENT_AMBULATORY_CARE_PROVIDER_SITE_OTHER): Payer: Medicare PPO | Admitting: Psychiatry

## 2021-07-11 ENCOUNTER — Other Ambulatory Visit: Payer: Self-pay

## 2021-07-11 DIAGNOSIS — Z630 Problems in relationship with spouse or partner: Secondary | ICD-10-CM

## 2021-07-11 DIAGNOSIS — F4321 Adjustment disorder with depressed mood: Secondary | ICD-10-CM | POA: Diagnosis not present

## 2021-07-11 DIAGNOSIS — G40909 Epilepsy, unspecified, not intractable, without status epilepticus: Secondary | ICD-10-CM | POA: Diagnosis not present

## 2021-07-11 NOTE — Progress Notes (Unsigned)
Psychotherapy Progress Note [facility] Luan Moore, PhD LP  Patient ID: Bill Fry)    MRN: [id number] Therapy format: Individual psychotherapy Date: 07/11/2021      Start: 4:25p     Stop: 5:10p     Time Spent: 45 min Location: In-person   Session narrative (presenting needs, interim history, self-report of stressors and symptoms, applications of prior therapy, status changes, and interventions made in session) Remains on driving restriction due to seizure history, wife conveying him.  Says he tried the conversation with [name] about the clutter, to no avail.  Feels like his lot is cast with her hoarding habit.  Has harbored suicidal feelings but clear he will not let them turn to action, both for his religious beliefs and for true care for his children what they would have to deal with.  Self-reminding in session that he has enjoyable things in his life, can shift attention to them.  Meanwhile, irritations remain about [name]'s hoarding and lack of intimacy.    Addressed his tendency to take it personally, and shamefully, how [name] does with the house.  Noted how friends have been able to visit and not been horrified.  Encouraged to notice how any reasonable observer would not blame him, either, so it can be a personal hardship without loading on the idea that it would embarrass him.  Further distinction, perhaps, from unwanted aspects of his own upbringing as he releases the antiquated idea of community shame and simply lets it be either noncatastrophic, on [name] and not himself, or both.  Resolved to de-personalize it and release demands.  Notes small experiments in putting his own things where he wants them, e.g., a weather station in a bathroom, where he pushed aside a few knickknacks.  Discussed option to assume the better about [name], that perhaps she has noticed and is decidedly not challenging it b/c she knows he needs his turf, or niche.  If curious, can check  perception as part of anti-resentment work and let her know.  Still frustrated sexually, notes [name] will only entertain intercourse right before she showers for the Bill.  Cancels all spontaneity, and acknowledges she carries baggage from her own upbringing that it's "dirty", baggage that may not be dealt with at this point.  Professes to be content forgoing any attempt at sexual intimacy at this point, though says [name] has noted her own need for hugs.  Figures he can settle for "roommates" at this point, not expect sexual success any more, doesn't want to be demanding, certainly.  Clarified fears and values, acknowledges he may be afraid to let hugs happen for fear it will ignite desire he can't get satisfied.  Encouraged to take a flexible, open approach to touch with [name], be willing to grant lowered expectations, acknowledge the risk he feels with touch, and be good enough also to engage nonsexual touch, too, as part of proving he is neither a tyrant nor brittle.  Therapeutic modalities: Cognitive Behavioral Therapy, Solution-Oriented/Positive Psychology, Humanistic/Existential, and Faith-sensitive  Mental Status/Observations:  Appearance:   Casual     Behavior:  Appropriate  Motor:  Normal  Speech/Language:   Clear and Coherent  Affect:  Appropriate  Mood:  dysthymic and less  Thought process:  normal  Thought content:    WNL  Sensory/Perceptual disturbances:    WNL  Orientation:  Fully oriented  Attention:  Good    Concentration:  Good  Memory:  WNL  Insight:    Good  Judgment:   Good  Impulse Control:  Good   Risk Assessment: Danger to Self: Intermittent thoughts of death, no plan/intent Self-injurious Behavior: No Danger to Others: No Physical Aggression / Violence: No Duty to Warn: No Access to Firearms a concern: No  Assessment of progress:  progressing  Diagnosis: No diagnosis found. Plan:  Options to assume best intentions with [name] or check perceptions as part of  declared anti-resentment project Prerogative to ask for change or accept lack of it re. Clutter  Agree safe to go PRN with therapy but notify and schedule if experiences urge to die for relief Other recommendations/advice as may be noted above Continue to utilize previously learned skills ad lib Maintain medication as prescribed and work faithfully with relevant prescriber(s) if any changes are desired or seem indicated Call the clinic on-call service, 988/hotline, 911, or present to [facility] or ER if any life-threatening psychiatric crisis Return for time at discretion. Already scheduled visit in this office Visit date not found.  Blanchie Serve, PhD Luan Moore, PhD LP Clinical Psychologist, [facility] [address], [place], [place] [address] [phone]

## 2021-08-16 ENCOUNTER — Other Ambulatory Visit: Payer: Self-pay | Admitting: Neurology

## 2021-08-27 ENCOUNTER — Ambulatory Visit: Payer: Medicare PPO | Admitting: Neurology

## 2021-08-28 ENCOUNTER — Encounter: Payer: Self-pay | Admitting: Neurology

## 2021-08-28 ENCOUNTER — Ambulatory Visit: Payer: Medicare PPO | Admitting: Neurology

## 2021-08-28 VITALS — BP 139/70 | HR 60 | Ht 72.0 in | Wt 183.5 lb

## 2021-08-28 DIAGNOSIS — R1011 Right upper quadrant pain: Secondary | ICD-10-CM | POA: Insufficient documentation

## 2021-08-28 DIAGNOSIS — G40209 Localization-related (focal) (partial) symptomatic epilepsy and epileptic syndromes with complex partial seizures, not intractable, without status epilepticus: Secondary | ICD-10-CM | POA: Diagnosis not present

## 2021-08-28 MED ORDER — DIVALPROEX SODIUM ER 500 MG PO TB24: 1000.0000 mg | ORAL_TABLET | Freq: Every evening | ORAL | 4 refills | Status: DC
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
## 2021-08-29 ENCOUNTER — Telehealth: Payer: Self-pay | Admitting: Neurology

## 2021-08-29 LAB — CBC WITH DIFFERENTIAL/PLATELET
Basophils Absolute: 0 10*3/uL (ref 0.0–0.2)
Basos: 1 %
EOS (ABSOLUTE): 0 10*3/uL (ref 0.0–0.4)
Eos: 1 %
Hematocrit: 36.6 % — ABNORMAL LOW (ref 37.5–51.0)
Hemoglobin: 12.3 g/dL — ABNORMAL LOW (ref 13.0–17.7)
Immature Grans (Abs): 0 10*3/uL (ref 0.0–0.1)
Immature Granulocytes: 0 %
Lymphocytes Absolute: 1.5 10*3/uL (ref 0.7–3.1)
Lymphs: 30 %
MCH: 31.1 pg (ref 26.6–33.0)
MCHC: 33.6 g/dL (ref 31.5–35.7)
MCV: 93 fL (ref 79–97)
Monocytes Absolute: 0.6 10*3/uL (ref 0.1–0.9)
Monocytes: 11 %
Neutrophils Absolute: 2.9 10*3/uL (ref 1.4–7.0)
Neutrophils: 57 %
Platelets: 129 10*3/uL — ABNORMAL LOW (ref 150–450)
RBC: 3.95 x10E6/uL — ABNORMAL LOW (ref 4.14–5.80)
RDW: 11.9 % (ref 11.6–15.4)
WBC: 5 10*3/uL (ref 3.4–10.8)

## 2021-08-29 LAB — COMPREHENSIVE METABOLIC PANEL
ALT: 11 IU/L (ref 0–44)
AST: 22 IU/L (ref 0–40)
Albumin/Globulin Ratio: 2.1 (ref 1.2–2.2)
Albumin: 4.1 g/dL (ref 3.6–4.6)
Alkaline Phosphatase: 51 IU/L (ref 44–121)
BUN/Creatinine Ratio: 23 (ref 10–24)
BUN: 26 mg/dL (ref 8–27)
Bilirubin Total: 0.4 mg/dL (ref 0.0–1.2)
CO2: 28 mmol/L (ref 20–29)
Calcium: 9.2 mg/dL (ref 8.6–10.2)
Chloride: 103 mmol/L (ref 96–106)
Creatinine, Ser: 1.14 mg/dL (ref 0.76–1.27)
Globulin, Total: 2 g/dL (ref 1.5–4.5)
Glucose: 94 mg/dL (ref 70–99)
Potassium: 4.6 mmol/L (ref 3.5–5.2)
Sodium: 142 mmol/L (ref 134–144)
Total Protein: 6.1 g/dL (ref 6.0–8.5)
eGFR: 64 mL/min/{1.73_m2} (ref >59)

## 2021-08-29 LAB — VALPROIC ACID LEVEL: Valproic Acid Lvl: 63 ug/mL (ref 50–100)

## 2021-08-29 NOTE — Telephone Encounter (Signed)
Called patient and relayed message. He verbalized understanding and expressed appreciation for the call. Pt requested a copy of lab results, which were printed and place in outgoing mail. All questions answered.

## 2021-08-29 NOTE — Telephone Encounter (Signed)
Please call labs show HGB stable at 12.3, mildly low platelets at 129, CMP is normal, Depakote is therapeutic at 63.   I would recheck the platelets in 4 months due to the low trend, could be related to Depakote?  I was able to look back in July 2019 platelets were 131, April 2021 163. May be his baseline, but I would recommend recheck this in 4 months, mention to PCP. I will put a reminder in Epic to recheck this in 4 months, he can come by for labs draw then of CBC, that would be around June 2023.

## 2021-09-13 ENCOUNTER — Ambulatory Visit: Payer: Medicare PPO | Admitting: Psychiatry

## 2021-12-19 ENCOUNTER — Other Ambulatory Visit (INDEPENDENT_AMBULATORY_CARE_PROVIDER_SITE_OTHER): Payer: Self-pay

## 2021-12-19 ENCOUNTER — Other Ambulatory Visit: Payer: Self-pay

## 2021-12-19 ENCOUNTER — Telehealth: Payer: Self-pay | Admitting: Neurology

## 2021-12-19 DIAGNOSIS — Z0289 Encounter for other administrative examinations: Secondary | ICD-10-CM

## 2021-12-19 DIAGNOSIS — G40209 Localization-related (focal) (partial) symptomatic epilepsy and epileptic syndromes with complex partial seizures, not intractable, without status epilepticus: Secondary | ICD-10-CM

## 2021-12-19 DIAGNOSIS — Z79899 Other long term (current) drug therapy: Secondary | ICD-10-CM

## 2021-12-19 NOTE — Telephone Encounter (Addendum)
Please call the patient, we should recheck CBC with diff to check platelets. Can be done at our office in the next few weeks or at PCP if appointment is coming up. If he comes here please place the order for lab. Thanks.   ----- Message from Suzzanne Cloud, NP sent at 08/29/2021  8:56 AM EST ----- Recheck CBC low platelets, related to Depakote?

## 2021-12-19 NOTE — Addendum Note (Signed)
Addended by: Verlin Grills on: 12/19/2021 09:34 AM   Modules accepted: Orders

## 2021-12-19 NOTE — Telephone Encounter (Signed)
I called pt and we discussed message. He is agreeable to coming to the office today for repeat blood test.   Order has been placed.  Pt will plan to come by office between 12 pm and 4 pm today.

## 2021-12-20 LAB — CBC WITH DIFFERENTIAL/PLATELET
Basophils Absolute: 0 10*3/uL (ref 0.0–0.2)
Basos: 0 %
EOS (ABSOLUTE): 0 10*3/uL (ref 0.0–0.4)
Eos: 1 %
Hematocrit: 36.5 % — ABNORMAL LOW (ref 37.5–51.0)
Hemoglobin: 12.4 g/dL — ABNORMAL LOW (ref 13.0–17.7)
Immature Grans (Abs): 0 10*3/uL (ref 0.0–0.1)
Immature Granulocytes: 0 %
Lymphocytes Absolute: 1.3 10*3/uL (ref 0.7–3.1)
Lymphs: 26 %
MCH: 30.8 pg (ref 26.6–33.0)
MCHC: 34 g/dL (ref 31.5–35.7)
MCV: 91 fL (ref 79–97)
Monocytes Absolute: 0.5 10*3/uL (ref 0.1–0.9)
Monocytes: 11 %
Neutrophils Absolute: 3.1 10*3/uL (ref 1.4–7.0)
Neutrophils: 62 %
Platelets: 136 10*3/uL — ABNORMAL LOW (ref 150–450)
RBC: 4.03 x10E6/uL — ABNORMAL LOW (ref 4.14–5.80)
RDW: 12.2 % (ref 11.6–15.4)
WBC: 5 10*3/uL (ref 3.4–10.8)

## 2021-12-24 ENCOUNTER — Telehealth: Payer: Self-pay | Admitting: Neurology

## 2021-12-24 NOTE — Telephone Encounter (Signed)
Pt called back wanting to make sure that his lab results will be faxed over to his PCP. Pt states that PCP is waiting on results.

## 2021-12-24 NOTE — Telephone Encounter (Signed)
I spoke to the patient and provided him with the lab results. He will further discuss the findings with his PCP.

## 2021-12-24 NOTE — Telephone Encounter (Signed)
I faxed CBC diff results to Dr. Dois Davenport office.  I called patient and advised him of this. Pt verbalized understanding.

## 2021-12-24 NOTE — Telephone Encounter (Signed)
Please call patient, recent CBC is stable, okay to continue with Depakote, will follow over time.  Platelets are slightly up at 136, were 129 in Feb 2023.  Reviewing prior labs, have been somewhat low in the past, in July 2019 131.  Depakote was initiated in June 2021. He should mention this to his PCP.

## 2022-01-02 ENCOUNTER — Other Ambulatory Visit: Payer: Self-pay | Admitting: Ophthalmology

## 2022-08-28 ENCOUNTER — Ambulatory Visit: Payer: Medicare PPO | Admitting: Neurology

## 2022-08-28 VITALS — BP 132/73 | HR 81 | Ht 72.0 in | Wt 183.0 lb

## 2022-08-28 DIAGNOSIS — G3184 Mild cognitive impairment, so stated: Secondary | ICD-10-CM

## 2022-08-28 DIAGNOSIS — G40209 Localization-related (focal) (partial) symptomatic epilepsy and epileptic syndromes with complex partial seizures, not intractable, without status epilepticus: Secondary | ICD-10-CM

## 2022-08-28 MED ORDER — DIVALPROEX SODIUM ER 500 MG PO TB24: 1000.0000 mg | ORAL_TABLET | Freq: Every evening | ORAL | 4 refills | Status: DC

## 2022-08-28 NOTE — Patient Instructions (Addendum)
Check labs today  Continue the Depakote  Call for any seizures  Monitor the memory, monitor the driving for safety See you back in 1 year
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
## 2022-08-29 LAB — COMPREHENSIVE METABOLIC PANEL
ALT: 8 IU/L (ref 0–44)
AST: 16 IU/L (ref 0–40)
Albumin/Globulin Ratio: 1.5 (ref 1.2–2.2)
Albumin: 3.8 g/dL (ref 3.7–4.7)
Alkaline Phosphatase: 54 IU/L (ref 44–121)
BUN/Creatinine Ratio: 22 (ref 10–24)
BUN: 20 mg/dL (ref 8–27)
Bilirubin Total: 0.2 mg/dL (ref 0.0–1.2)
CO2: 24 mmol/L (ref 20–29)
Calcium: 9.4 mg/dL (ref 8.6–10.2)
Chloride: 101 mmol/L (ref 96–106)
Creatinine, Ser: 0.91 mg/dL (ref 0.76–1.27)
Globulin, Total: 2.6 g/dL (ref 1.5–4.5)
Glucose: 99 mg/dL (ref 70–99)
Potassium: 4.3 mmol/L (ref 3.5–5.2)
Sodium: 143 mmol/L (ref 134–144)
Total Protein: 6.4 g/dL (ref 6.0–8.5)
eGFR: 84 mL/min/{1.73_m2} (ref >59)

## 2022-08-29 LAB — CBC WITH DIFFERENTIAL/PLATELET
Basophils Absolute: 0 10*3/uL (ref 0.0–0.2)
Basos: 0 %
EOS (ABSOLUTE): 0.1 10*3/uL (ref 0.0–0.4)
Eos: 1 %
Hematocrit: 37.8 % (ref 37.5–51.0)
Hemoglobin: 12.8 g/dL — ABNORMAL LOW (ref 13.0–17.7)
Immature Grans (Abs): 0 10*3/uL (ref 0.0–0.1)
Immature Granulocytes: 0 %
Lymphocytes Absolute: 1.5 10*3/uL (ref 0.7–3.1)
Lymphs: 27 %
MCH: 30.8 pg (ref 26.6–33.0)
MCHC: 33.9 g/dL (ref 31.5–35.7)
MCV: 91 fL (ref 79–97)
Monocytes Absolute: 0.7 10*3/uL (ref 0.1–0.9)
Monocytes: 12 %
Neutrophils Absolute: 3.3 10*3/uL (ref 1.4–7.0)
Neutrophils: 60 %
Platelets: 155 10*3/uL (ref 150–450)
RBC: 4.15 x10E6/uL (ref 4.14–5.80)
RDW: 12.8 % (ref 11.6–15.4)
WBC: 5.5 10*3/uL (ref 3.4–10.8)

## 2022-08-29 LAB — VALPROIC ACID LEVEL: Valproic Acid Lvl: 55 ug/mL (ref 50–100)

## 2022-09-01 ENCOUNTER — Telehealth: Payer: Self-pay

## 2022-09-01 NOTE — Telephone Encounter (Signed)
Called and relayed note per Judson Roch, Pt verbalized understanding. Pt had no questions at this time but was encouraged to call back if questions arise.

## 2023-05-20 ENCOUNTER — Telehealth: Payer: Self-pay

## 2023-05-20 NOTE — Telephone Encounter (Signed)
  Pulled a voicemail from the phone room from patient requesting a call back from Dr Terrace Arabia or one of her nurses.   States he lost his license due to his seizure. Dr Terrace Arabia saw him and he has had his license back for about a year and 8 months. His license currently has restrictions and he is wanting to see about getting those restrictions removed.   He has a form that needs to be completed. He is going to bring it in today and is aware of the form fee of $50. He does need the form completed within 30 days, so just make a note of that on the slip for the nurse.

## 2023-05-21 DIAGNOSIS — Z0289 Encounter for other administrative examinations: Secondary | ICD-10-CM

## 2023-06-17 NOTE — Telephone Encounter (Signed)
Received completed form from medical records. Form was faxed to the [facility] at the fax number given to [place] by the patient

## 2023-06-22 NOTE — Telephone Encounter (Signed)
Pt called to check on forms completed by neurologist and if have been faxed to [facility]. Informed pt form was completed and faxed to number given by patient.
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
## 2023-08-20 ENCOUNTER — Ambulatory Visit: Payer: Medicare PPO | Admitting: Neurology

## 2023-08-20 ENCOUNTER — Encounter: Payer: Self-pay | Admitting: Neurology

## 2023-08-20 VITALS — BP 135/84 | HR 69 | Ht 71.0 in | Wt 195.0 lb

## 2023-08-20 DIAGNOSIS — G40209 Localization-related (focal) (partial) symptomatic epilepsy and epileptic syndromes with complex partial seizures, not intractable, without status epilepticus: Secondary | ICD-10-CM

## 2023-08-20 DIAGNOSIS — G3184 Mild cognitive impairment, so stated: Secondary | ICD-10-CM | POA: Diagnosis not present

## 2023-08-20 MED ORDER — DIVALPROEX SODIUM ER 500 MG PO TB24: 1000.0000 mg | ORAL_TABLET | Freq: Every evening | ORAL | 4 refills | Status: AC

## 2023-08-20 NOTE — Patient Instructions (Signed)
 Great to see you today.  Will continue Depakote  for seizure prevention.  Check labs today.  Call for any seizure activity.  Follow-up in 1 year.  Thanks!!

## 2023-08-21 ENCOUNTER — Encounter: Payer: Self-pay | Admitting: Neurology

## 2023-08-21 LAB — COMPREHENSIVE METABOLIC PANEL
ALT: 12 [IU]/L (ref 0–44)
AST: 21 [IU]/L (ref 0–40)
Albumin: 4.1 g/dL (ref 3.7–4.7)
Alkaline Phosphatase: 57 [IU]/L (ref 44–121)
BUN/Creatinine Ratio: 30 — ABNORMAL HIGH (ref 10–24)
BUN: 27 mg/dL (ref 8–27)
Bilirubin Total: 0.3 mg/dL (ref 0.0–1.2)
CO2: 26 mmol/L (ref 20–29)
Calcium: 9.5 mg/dL (ref 8.6–10.2)
Chloride: 106 mmol/L (ref 96–106)
Creatinine, Ser: 0.9 mg/dL (ref 0.76–1.27)
Globulin, Total: 2.2 g/dL (ref 1.5–4.5)
Glucose: 97 mg/dL (ref 70–99)
Potassium: 4.1 mmol/L (ref 3.5–5.2)
Sodium: 144 mmol/L (ref 134–144)
Total Protein: 6.3 g/dL (ref 6.0–8.5)
eGFR: 84 mL/min/{1.73_m2} (ref >59)

## 2023-08-21 LAB — CBC WITH DIFFERENTIAL/PLATELET
Basophils Absolute: 0 10*3/uL (ref 0.0–0.2)
Basos: 1 %
EOS (ABSOLUTE): 0 10*3/uL (ref 0.0–0.4)
Eos: 1 %
Hematocrit: 39.4 % (ref 37.5–51.0)
Hemoglobin: 13.2 g/dL (ref 13.0–17.7)
Immature Grans (Abs): 0 10*3/uL (ref 0.0–0.1)
Immature Granulocytes: 0 %
Lymphocytes Absolute: 1.3 10*3/uL (ref 0.7–3.1)
Lymphs: 26 %
MCH: 31.7 pg (ref 26.6–33.0)
MCHC: 33.5 g/dL (ref 31.5–35.7)
MCV: 95 fL (ref 79–97)
Monocytes Absolute: 0.6 10*3/uL (ref 0.1–0.9)
Monocytes: 11 %
Neutrophils Absolute: 3.2 10*3/uL (ref 1.4–7.0)
Neutrophils: 61 %
Platelets: 135 10*3/uL — ABNORMAL LOW (ref 150–450)
RBC: 4.17 x10E6/uL (ref 4.14–5.80)
RDW: 12.1 % (ref 11.6–15.4)
WBC: 5.2 10*3/uL (ref 3.4–10.8)

## 2023-08-21 LAB — VALPROIC ACID LEVEL: Valproic Acid Lvl: 66 ug/mL (ref 50–100)

## 2023-09-07 ENCOUNTER — Telehealth: Payer: Self-pay | Admitting: Neurology

## 2023-09-07 NOTE — Telephone Encounter (Signed)
 Patient requesting a call about results. Patient stated, cannot get on his MyChart.

## 2024-04-01 ENCOUNTER — Ambulatory Visit: Admitting: Professional Counselor

## 2024-04-04 ENCOUNTER — Other Ambulatory Visit (HOSPITAL_BASED_OUTPATIENT_CLINIC_OR_DEPARTMENT_OTHER): Payer: Self-pay

## 2024-04-04 MED ORDER — COMIRNATY 30 MCG/0.3ML IM SUSY
0.3000 mL | PREFILLED_SYRINGE | Freq: Once | INTRAMUSCULAR | 0 refills | Status: AC
  Filled 2024-04-04: qty 0.3, 1d supply, fill #0

## 2024-07-26 ENCOUNTER — Other Ambulatory Visit: Payer: Self-pay | Admitting: Urology

## 2024-07-26 DIAGNOSIS — N401 Enlarged prostate with lower urinary tract symptoms: Secondary | ICD-10-CM

## 2024-07-26 DIAGNOSIS — R351 Nocturia: Secondary | ICD-10-CM

## 2024-07-26 DIAGNOSIS — R3912 Poor urinary stream: Secondary | ICD-10-CM

## 2024-08-03 ENCOUNTER — Inpatient Hospital Stay
Admission: RE | Admit: 2024-08-03 | Discharge: 2024-08-03 | Disposition: A | Source: Ambulatory Visit | Attending: Urology

## 2024-08-03 DIAGNOSIS — R351 Nocturia: Secondary | ICD-10-CM

## 2024-08-03 DIAGNOSIS — N401 Enlarged prostate with lower urinary tract symptoms: Secondary | ICD-10-CM

## 2024-08-03 DIAGNOSIS — R3912 Poor urinary stream: Secondary | ICD-10-CM

## 2024-08-03 MED ORDER — IOPAMIDOL (ISOVUE-370) INJECTION 76%
80.0000 mL | Freq: Once | INTRAVENOUS | Status: AC | PRN
  Administered 2024-08-03: 80 mL via INTRAVENOUS

## 2024-08-10 ENCOUNTER — Other Ambulatory Visit: Payer: Self-pay

## 2024-08-10 ENCOUNTER — Ambulatory Visit: Admitting: Urology

## 2024-08-10 VITALS — BP 138/84 | HR 112 | Ht 71.0 in | Wt 190.0 lb

## 2024-08-10 DIAGNOSIS — R972 Elevated prostate specific antigen [PSA]: Secondary | ICD-10-CM | POA: Diagnosis not present

## 2024-08-10 DIAGNOSIS — N401 Enlarged prostate with lower urinary tract symptoms: Secondary | ICD-10-CM

## 2024-08-10 DIAGNOSIS — N138 Other obstructive and reflux uropathy: Secondary | ICD-10-CM | POA: Diagnosis not present

## 2024-08-10 NOTE — Patient Instructions (Signed)

## 2024-08-10 NOTE — Progress Notes (Signed)
 Surgical Physician Order Form [facility] [place]  Dr. Redell Burnet, MD  * Scheduling expectation : Next Available  *Length of Case: 1.5 hours  *Clearance needed: no  *Anticoagulation Instructions: Hold Eliquis  *Aspirin Instructions: Can continue 81 mg aspirin if needed  *Post-op visit Date/Instructions:  1-3 day cath removal  *Diagnosis: BPH w/urinary obstruction  *Procedure:  HOLEP (47350)   Additional orders: N/A  -Admit type: OUTpatient  -Anesthesia: General  -VTE Prophylaxis Standing Order SCD's       Other:   -Standing Lab Orders Per Anesthesia    Lab other: UA&Urine Culture  -Standing Test orders EKG/Chest x-ray per Anesthesia       Test other:   - Medications:  Ancef  2gm IV  -Other orders:  N/A

## 2024-08-10 NOTE — Progress Notes (Signed)
 "  08/10/24 2:54 PM   [facility] 1941/01/26 [id number]  CC: BPH, discuss HOLEP  HPI: 84 year old male referred by Dr. Selma in [place] to discuss HOLEP.  IPSS score 28, recent cystoscopy showed obstructive prostate with normal bladder, TRUS volume reportedly 123g, recent CT scan with prostate measuring 90g.  On maximal medical therapy with Flomax and finasteride.  History of mildly elevated PSA of 8 with low PSA density and he deferred biopsy.  Primary urinary symptoms are weak stream, frequency, nocturia 5 times overnight, he is also having some leakage.  IPSS score today 31, quality-of-life terrible.  Bladder scan today 2000 mL, however I think we are seeing the large left renal cyst based on decompressed bladder on CT just last week 08/03/2024.   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 He has never used smokeless tobacco. He reports current alcohol use. He reports that he does not use drugs.  Physical Exam: BP 138/84 (BP Location: Left Arm, Patient Position: Sitting, Cuff Size: Normal)   Pulse (!) 112   Ht 5' 11 (1.803 m)   Wt 190 lb (86.2 kg)   SpO2 98%    BMI 26.50 kg/m    Constitutional:  Alert and oriented, No acute distress. Cardiovascular: No clubbing, cyanosis, or edema. Respiratory: Normal respiratory effort, no increased work of breathing. GI: Abdomen is soft, nontender, nondistended, no abdominal masses   Laboratory Data: Reviewed, see HPI  Pertinent Imaging: I have personally viewed and interpreted the CT pelvis January 2026 showing a 90 g prostate, large left renal cyst.  Assessment & Plan:   84 year old male with 90 g prostate, obstructive symptoms, cystoscopy with obstructive appearing prostate, failure of medical management interested in more definitive treatment option with HOLEP.  We discussed the risks and benefits of HoLEP at length.  The procedure requires general anesthesia and takes 1 to 2 hours, and a holmium laser is used to enucleate the prostate and push this tissue into the bladder.  A morcellator is then used to remove this tissue, which is sent for pathology.  The vast majority(>95%) of patients are able to discharge the same day with a catheter in place for 2 to 3 days, and will follow-up in clinic for a voiding trial.  We specifically discussed the risks of bleeding, infection, retrograde ejaculation, temporary urgency and urge incontinence, very low risk of long-term incontinence, urethral stricture/bladder neck contracture, pathologic evaluation of prostate  tissue and possible detection of prostate cancer or other malignancy, and possible need for additional procedures.  Schedule HOLEP(90g)   Redell Burnet, MD 08/10/2024  [facility] [address] [place], [place] [address] [phone]   "

## 2024-08-17 ENCOUNTER — Telehealth: Payer: Self-pay

## 2024-08-17 NOTE — Progress Notes (Signed)
" °  Phone Number: [phone] for Surgical Coordinator Fax Number: [phone]  REQUEST FOR SURGICAL CLEARANCE      Date: 08/17/2024  Faxed to: Dr. Burney, MD  Surgeon: Dr. Redell Burnet, MD     Date of Surgery: 09/19/2024  Operation: Holmium Laser Enucleation of the Prostate   Anesthesia Type: General   Diagnosis: Benign Prostatic Hyperplasia with Urinary Obstruction  Patient Requires:   Cardiac / Vascular Clearance : Yes  Reason: Patient will need to hold Eliquis for 2 days prior to surgery.    Risk Assessment:    Low   []       Moderate   []     High   []           This patient is optimized for surgery  YES []       NO   []    I recommend further assessment/workup prior to surgery. YES []      NO  []   Appointment scheduled for: _______________________   Further recommendations: ____________________________________     Physician Signature:__________________________________   Printed Name: ________________________________________   Date: _________________    "

## 2024-08-17 NOTE — Progress Notes (Signed)
" ° °  [facility] Surgical Posting Form  Surgery Date: Date: 09/19/2024  Surgeon: Dr. Redell Burnet, MD  Inpt ( No  )   Outpt (Yes)   Obs ( No  )   Diagnosis: N40.1, N13.8 Benign Prostatic Hyperplasia with Urinary Obstruction  -CPT: 47350  Surgery: Holmium  Laser Enucleation of the Prostate  Stop Anticoagulations: Yes, will need to hold Eliquis  Cardiac/Medical/Pulmonary Clearance needed: Yes  Clearance needed from Dr: Burney with [facility]  Clearance request sent on: Date: 08/17/24  *Orders entered into EPIC  Date: 08/17/24   *Case booked in EPIC  Date: 08/17/24  *Notified pt of Surgery: Date: 08/17/24  PRE-OP UA & CX: yes, will obtain in clinic on 09/07/2024  *Placed into Prior Authorization Work [place] Date: 08/17/24  Assistant/laser/rep:No                "

## 2024-08-17 NOTE — Telephone Encounter (Signed)
 Per Dr. Francisca, Patient is to be scheduled for Holmium Laser Enucleation of the Prostate   Bill Fry was contacted and possible surgical dates were discussed, Monday March 9th, 2026 was agreed upon for surgery.   Patient was instructed that Dr. Francisca will require them to provide a pre-op UA & CX prior to surgery. This was ordered and scheduled drop off appointment was made for 09/07/2024.    Patient was directed to call [phone] between 1-3pm the day before surgery to find out surgical arrival time.  Instructions were given not to eat or drink from midnight on the night before surgery and have a driver for the day of surgery. On the surgery day patient was instructed to enter through the [facility] entrance of [facility] report the Same Day Surgery desk.   Pre-Admit Testing will be in contact via phone to set up an interview with the anesthesia team to review your history and medications prior to surgery.   Reminder of this information was sent via Mail to the patient.

## 2024-08-25 ENCOUNTER — Ambulatory Visit: Payer: Medicare PPO | Admitting: Neurology

## 2024-09-07 ENCOUNTER — Other Ambulatory Visit

## 2024-09-19 ENCOUNTER — Ambulatory Visit: Admit: 2024-09-19 | Admitting: Urology

## 2024-09-21 ENCOUNTER — Encounter: Admitting: Urology

## 2024-09-23 ENCOUNTER — Ambulatory Visit: Admitting: Cardiology

## 2025-01-04 ENCOUNTER — Ambulatory Visit: Admitting: Urology
